# Patient Record
Sex: Male | Born: 1955
Health system: Southern US, Community
[De-identification: ages and names within clinical notes are randomized; demographics above are authoritative.]

## PROBLEM LIST (undated history)

## (undated) DIAGNOSIS — K219 Gastro-esophageal reflux disease without esophagitis: Secondary | ICD-10-CM

## (undated) DIAGNOSIS — N200 Calculus of kidney: Secondary | ICD-10-CM

## (undated) DIAGNOSIS — M1990 Unspecified osteoarthritis, unspecified site: Secondary | ICD-10-CM

## (undated) DIAGNOSIS — J45909 Unspecified asthma, uncomplicated: Secondary | ICD-10-CM

## (undated) DIAGNOSIS — E785 Hyperlipidemia, unspecified: Secondary | ICD-10-CM

## (undated) DIAGNOSIS — I10 Essential (primary) hypertension: Secondary | ICD-10-CM

## (undated) DIAGNOSIS — M109 Gout, unspecified: Secondary | ICD-10-CM

## (undated) DIAGNOSIS — T7840XA Allergy, unspecified, initial encounter: Secondary | ICD-10-CM

## (undated) DIAGNOSIS — N189 Chronic kidney disease, unspecified: Secondary | ICD-10-CM

## (undated) HISTORY — DX: Unspecified osteoarthritis, unspecified site: M19.90

## (undated) HISTORY — DX: Gastro-esophageal reflux disease without esophagitis: K21.9

## (undated) HISTORY — PX: LITHOTRIPSY: SUR834

## (undated) HISTORY — DX: Calculus of kidney: N20.0

## (undated) HISTORY — PX: OTHER SURGICAL HISTORY: SHX169

## (undated) HISTORY — DX: Unspecified asthma, uncomplicated: J45.909

## (undated) HISTORY — DX: Allergy, unspecified, initial encounter: T78.40XA

## (undated) HISTORY — DX: Essential (primary) hypertension: I10

## (undated) HISTORY — DX: Chronic kidney disease, unspecified: N18.9

## (undated) HISTORY — DX: Gout, unspecified: M10.9

## (undated) HISTORY — DX: Hyperlipidemia, unspecified: E78.5

---

## 2001-05-10 ENCOUNTER — Encounter: Admission: RE | Admit: 2001-05-10 | Discharge: 2001-05-10 | Payer: Self-pay | Admitting: Orthopaedic Surgery

## 2001-05-10 ENCOUNTER — Encounter: Payer: Self-pay | Admitting: Orthopaedic Surgery

## 2003-05-09 ENCOUNTER — Emergency Department (HOSPITAL_COMMUNITY): Admission: EM | Admit: 2003-05-09 | Discharge: 2003-05-09 | Payer: Self-pay | Admitting: Emergency Medicine

## 2007-05-09 ENCOUNTER — Ambulatory Visit: Payer: Self-pay | Admitting: Gastroenterology

## 2007-05-23 ENCOUNTER — Ambulatory Visit: Payer: Self-pay | Admitting: Gastroenterology

## 2008-06-11 DIAGNOSIS — Z8619 Personal history of other infectious and parasitic diseases: Secondary | ICD-10-CM | POA: Insufficient documentation

## 2011-03-21 ENCOUNTER — Ambulatory Visit (INDEPENDENT_AMBULATORY_CARE_PROVIDER_SITE_OTHER): Payer: BC Managed Care – PPO

## 2011-03-21 DIAGNOSIS — R05 Cough: Secondary | ICD-10-CM

## 2011-03-21 DIAGNOSIS — E789 Disorder of lipoprotein metabolism, unspecified: Secondary | ICD-10-CM

## 2011-03-21 DIAGNOSIS — I10 Essential (primary) hypertension: Secondary | ICD-10-CM

## 2011-03-21 DIAGNOSIS — M109 Gout, unspecified: Secondary | ICD-10-CM

## 2011-09-25 ENCOUNTER — Ambulatory Visit (INDEPENDENT_AMBULATORY_CARE_PROVIDER_SITE_OTHER): Payer: BC Managed Care – PPO | Admitting: Family Medicine

## 2011-09-25 VITALS — BP 115/80 | HR 56 | Temp 98.7°F | Resp 16 | Ht 70.75 in | Wt 188.2 lb

## 2011-09-25 DIAGNOSIS — I10 Essential (primary) hypertension: Secondary | ICD-10-CM

## 2011-09-25 DIAGNOSIS — E785 Hyperlipidemia, unspecified: Secondary | ICD-10-CM

## 2011-09-25 DIAGNOSIS — M109 Gout, unspecified: Secondary | ICD-10-CM

## 2011-09-25 LAB — COMPREHENSIVE METABOLIC PANEL WITH GFR
ALT: 34 U/L (ref 0–53)
CO2: 28 meq/L (ref 19–32)
Calcium: 10.4 mg/dL (ref 8.4–10.5)
Chloride: 102 meq/L (ref 96–112)
Creat: 0.9 mg/dL (ref 0.50–1.35)
Glucose, Bld: 93 mg/dL (ref 70–99)
Sodium: 140 meq/L (ref 135–145)
Total Bilirubin: 0.6 mg/dL (ref 0.3–1.2)
Total Protein: 7.6 g/dL (ref 6.0–8.3)

## 2011-09-25 LAB — COMPREHENSIVE METABOLIC PANEL
AST: 27 U/L (ref 0–37)
Albumin: 5.1 g/dL (ref 3.5–5.2)
Alkaline Phosphatase: 86 U/L (ref 39–117)
BUN: 9 mg/dL (ref 6–23)
Potassium: 5 mEq/L (ref 3.5–5.3)

## 2011-09-25 LAB — POCT URINALYSIS DIPSTICK
Bilirubin, UA: NEGATIVE
Glucose, UA: NEGATIVE
Ketones, UA: NEGATIVE
Leukocytes, UA: NEGATIVE
Nitrite, UA: NEGATIVE
Protein, UA: NEGATIVE
Spec Grav, UA: 1.02
Urobilinogen, UA: 0.2
pH, UA: 5.5

## 2011-09-25 MED ORDER — HYDROCHLOROTHIAZIDE 25 MG PO TABS: 25.0000 mg | ORAL_TABLET | Freq: Every day | ORAL | Status: DC

## 2011-09-25 MED ORDER — BENAZEPRIL HCL 10 MG PO TABS: 10.0000 mg | ORAL_TABLET | Freq: Every day | ORAL | Status: DC

## 2011-09-25 MED ORDER — ROSUVASTATIN CALCIUM 40 MG PO TABS: 40.0000 mg | ORAL_TABLET | Freq: Every day | ORAL | Status: DC

## 2011-09-25 MED ORDER — ALLOPURINOL 300 MG PO TABS: 300.0000 mg | ORAL_TABLET | Freq: Every day | ORAL | Status: DC

## 2011-09-25 NOTE — Progress Notes (Signed)
  [facility]:  Office Visit  Chief Complaint:  Chief Complaint  Patient presents with  . Medication Refill  . glucose check    fasting    HPI: Joshua [name] is a 56 y.o. male who complains of : Medication refills 1. HTN-120s/70s at home. No SEs. Does have minimal dizziness with standing and sitting too fast. Intermittent.  2. Hyperlipidemia-no SEs 3. Gout-Allopurinol  Compliant with meds.   Past Medical History  Diagnosis Date  . Hypertension   . Hyperlipidemia    History reviewed. No pertinent past surgical history. History   Social History  . Marital Status: Married    Spouse Name: N/A    Number of Children: N/A  . Years of Education: N/A   Social History Main Topics  . Smoking status: Never Smoker   . Smokeless tobacco: None  . Alcohol Use: No  . Drug Use: No  . Sexually Active: None   Other Topics Concern  . None   Social History Narrative  . None   Family History  Problem Relation Age of Onset  . Hyperlipidemia Mother   . Alcohol abuse Father    Allergies  Allergen Reactions  . Aspirin    Prior to Admission medications   Medication Sig Start Date End Date Taking? Authorizing Provider  allopurinol (ZYLOPRIM) 300 MG tablet Take 300 mg by mouth daily.   Yes Historical Provider, MD  benazepril (LOTENSIN) 10 MG tablet Take 10 mg by mouth daily.   Yes Historical Provider, MD  hydrochlorothiazide (HYDRODIURIL) 25 MG tablet Take 25 mg by mouth daily.   Yes Historical Provider, MD  rosuvastatin (CRESTOR) 40 MG tablet Take 40 mg by mouth daily.   Yes Historical Provider, MD     ROS: The patient denies fevers, chills, night sweats, unintentional weight loss, chest pain, palpitations, wheezing, dyspnea on exertion, nausea, vomiting, abdominal pain, dysuria, hematuria, melena, numbness, weakness, or tingling.   All other systems have been reviewed and were otherwise negative with the exception of those mentioned in the HPI and as above.     PHYSICAL EXAM: Filed Vitals:   09/25/11 1444  BP: 115/80  Pulse: 56  Temp: 98.7 F (37.1 C)  Resp: 16   Filed Vitals:   09/25/11 1444  Height: 5' 10.75" (1.797 m)  Weight: 188 lb 3.2 oz (85.367 kg)   Body mass index is 26.43 kg/(m^2).  General: Alert, no acute distress HEENT:  Normocephalic, atraumatic, oropharynx patent.  Cardiovascular:  Regular rate and rhythm, no rubs murmurs or gallops.  No Carotid bruits, radial pulse intact. No pedal edema.  Respiratory: Clear to auscultation bilaterally.  No wheezes, rales, or rhonchi.  No cyanosis, no use of accessory musculature GI: No organomegaly, abdomen is soft and non-tender, positive bowel sounds.  No masses. Skin: No rashes. Neurologic: Facial musculature symmetric. Psychiatric: Patient is appropriate throughout our interaction. Lymphatic: No cervical lymphadenopathy Musculoskeletal: Gait intact.   LABS:    EKG/XRAY:   Primary read interpreted by Dr. Conley Rolls at [facility].   ASSESSMENT/PLAN: Encounter Diagnoses  Name Primary?  . HTN (hypertension) Yes  . Hyperlipemia   . Gout    Pending labs: UA dip, CMP, TSH Recent fasting lipid was HDL 41, TG 69, TC 127 Meds refilled for 90 days with 1 RF F/u in 6 months   Joshua Gosch PHUONG, DO 09/28/2011 11:16 AM

## 2011-09-26 LAB — TSH: TSH: 1.876 u[IU]/mL (ref 0.350–4.500)

## 2011-10-01 ENCOUNTER — Encounter: Payer: Self-pay | Admitting: Family Medicine

## 2012-01-16 ENCOUNTER — Ambulatory Visit: Payer: BC Managed Care – PPO

## 2012-01-16 ENCOUNTER — Ambulatory Visit (INDEPENDENT_AMBULATORY_CARE_PROVIDER_SITE_OTHER): Payer: BC Managed Care – PPO | Admitting: Family Medicine

## 2012-01-16 VITALS — BP 117/69 | HR 66 | Temp 98.4°F | Resp 16 | Ht 70.75 in | Wt 181.2 lb

## 2012-01-16 DIAGNOSIS — N509 Disorder of male genital organs, unspecified: Secondary | ICD-10-CM

## 2012-01-16 DIAGNOSIS — N50819 Testicular pain, unspecified: Secondary | ICD-10-CM

## 2012-01-16 DIAGNOSIS — R109 Unspecified abdominal pain: Secondary | ICD-10-CM

## 2012-01-16 LAB — COMPREHENSIVE METABOLIC PANEL
ALT: 29 U/L (ref 0–53)
AST: 28 U/L (ref 0–37)
CO2: 32 mEq/L (ref 19–32)
Calcium: 10.5 mg/dL (ref 8.4–10.5)
Chloride: 102 mEq/L (ref 96–112)
Creat: 0.99 mg/dL (ref 0.50–1.35)
Potassium: 4.3 mEq/L (ref 3.5–5.3)
Sodium: 139 mEq/L (ref 135–145)
Total Protein: 7.5 g/dL (ref 6.0–8.3)

## 2012-01-16 LAB — POCT CBC
Granulocyte percent: 60 % (ref 37–80)
HCT, POC: 49.9 % (ref 43.5–53.7)
Hemoglobin: 16.5 g/dL (ref 14.1–18.1)
Lymph, poc: 2.4 (ref 0.6–3.4)
MCH, POC: 29.4 pg (ref 27–31.2)
MCHC: 33.1 g/dL (ref 31.8–35.4)
MCV: 89 fL (ref 80–97)
MID (cbc): 0.4 (ref 0–0.9)
MPV: 11.9 fL (ref 0–99.8)
POC Granulocyte: 4.3 (ref 2–6.9)
POC LYMPH PERCENT: 34 % (ref 10–50)
POC MID %: 6 % (ref 0–12)
Platelet Count, POC: 290 K/uL (ref 142–424)
RBC: 5.61 M/uL (ref 4.69–6.13)
RDW, POC: 13 %
WBC: 7.2 K/uL (ref 4.6–10.2)

## 2012-01-16 LAB — POCT URINALYSIS DIPSTICK
Glucose, UA: NEGATIVE
Nitrite, UA: NEGATIVE
Urobilinogen, UA: 0.2

## 2012-01-16 LAB — POCT UA - MICROSCOPIC ONLY
Casts, Ur, LPF, POC: NEGATIVE
Yeast, UA: NEGATIVE

## 2012-01-16 LAB — PSA: PSA: 4.72 ng/mL — ABNORMAL HIGH (ref <=4.00)

## 2012-01-16 NOTE — Progress Notes (Signed)
[address]   [place], [place]  [address]   [phone]  Subjective:    Patient ID: Joshua [name], male    DOB: 1956-03-08, 56 y.o.   MRN: [id number]  HPIThis 56 y.o. male presents for evaluation of lower abdominal pain intermittent.  Onset one month ago.  Pain and pressure lower abdomen, groin.  Duration all day.  Sit and drive all day.  Resolves for several days.  Moderate pain.  Tender to touch.  Pain free today.  No fever/chills/sweats/myalgias/malaise.  No nausea, vomiting, diarrhea, constipation.  No bloody or black stools.  No increase flatus.  Feels like gas.  No dysuria, hematuria, nocturia, frequency, urgency.  No genital swelling.  +tender to touch of testicles; pain radiates into lower abdomen.  No new sexual partners; married.  No history of prostatitis.  Last prostate exam three years ago. No recent medication change; no recent travel.  Colonoscopy age 68 normal; repeat in ten years.  No weight loss unintentional or weight loss.  Running a lot.  Position changes has not effect.  No indigestion, heartburn.  History of nephrolithiasis.      Review of Systems  Constitutional: Negative for fever, chills, diaphoresis and fatigue.  Gastrointestinal: Positive for abdominal pain. Negative for nausea, vomiting, diarrhea, constipation, blood in stool, abdominal distention, anal bleeding and rectal pain.  Genitourinary: Positive for testicular pain. Negative for dysuria, urgency, frequency, hematuria, flank pain, decreased urine volume, discharge, penile swelling, scrotal swelling, difficulty urinating, genital sores and penile pain.  Musculoskeletal: Negative for myalgias, back pain, joint swelling, arthralgias and gait problem.  Skin: Negative for color change, pallor, rash and wound.       Objective:   Physical Exam  Nursing note and vitals reviewed. Constitutional: He appears well-developed and well-nourished.  Eyes: Conjunctivae normal are normal. Pupils are equal, round, and  reactive to light.  Neck: Normal range of motion. Neck supple. No thyromegaly present.  Cardiovascular: Normal rate, regular rhythm and normal heart sounds.   No murmur heard. Pulmonary/Chest: Effort normal and breath sounds normal. He has no wheezes. He has no rales. He exhibits no tenderness.  Abdominal: Soft. Bowel sounds are normal. He exhibits no distension and no mass. There is no tenderness. There is no rebound and no guarding. Hernia confirmed negative in the right inguinal area and confirmed negative in the left inguinal area.  Genitourinary: Rectum normal, testes normal and penis normal. Rectal exam shows no fissure, no mass, no tenderness and anal tone normal. Prostate is enlarged. Prostate is not tender. Right testis shows no mass, no swelling and no tenderness. Left testis shows no mass, no swelling and no tenderness. Circumcised. No penile tenderness.  Musculoskeletal:       Right hip: He exhibits normal range of motion, normal strength and no tenderness.       Left hip: He exhibits normal range of motion, normal strength and no tenderness.       Lumbar back: He exhibits normal range of motion, no tenderness and no bony tenderness.  Lymphadenopathy:    He has no cervical adenopathy.       Right: No inguinal adenopathy present.       Left: No inguinal adenopathy present.     Results for orders placed in visit on 01/16/12  POCT CBC      Component Value Range   WBC 7.2  4.6 - 10.2 K/uL   Lymph, poc 2.4  0.6 - 3.4   POC LYMPH PERCENT 34.0  10 -  50 %L   MID (cbc) 0.4  0 - 0.9   POC MID % 6.0  0 - 12 %M   POC Granulocyte 4.3  2 - 6.9   Granulocyte percent 60.0  37 - 80 %G   RBC 5.61  4.69 - 6.13 M/uL   Hemoglobin 16.5  14.1 - 18.1 g/dL   HCT, POC 84.1  32.4 - 53.7 %   MCV 89.0  80 - 97 fL   MCH, POC 29.4  27 - 31.2 pg   MCHC 33.1  31.8 - 35.4 g/dL   RDW, POC 40.1     Platelet Count, POC 290  142 - 424 K/uL   MPV 11.9  0 - 99.8 fL  POCT UA - MICROSCOPIC ONLY       Component Value Range   WBC, Ur, HPF, POC 0-1     RBC, urine, microscopic 0-1     Bacteria, U Microscopic neg     Mucus, UA neg     Epithelial cells, urine per micros neg     Crystals, Ur, HPF, POC neg     Casts, Ur, LPF, POC neg     Yeast, UA neg    POCT URINALYSIS DIPSTICK      Component Value Range   Color, UA yellow     Clarity, UA clear     Glucose, UA neg     Bilirubin, UA neg     Ketones, UA neg     Spec Grav, UA 1.015     Blood, UA trace     pH, UA 7.0     Protein, UA neg     Urobilinogen, UA 0.2     Nitrite, UA neg     Leukocytes, UA Trace      [facility] reading (PRIMARY) by  Dr. Katrinka Blazing.  KUB:  NAD; moderate amount of stool.       Assessment & Plan:   1. Abdominal pain  POCT CBC, POCT UA - Microscopic Only, POCT urinalysis dipstick, Urine culture, PSA, Comprehensive metabolic panel, DG Abd 1 View  2. Testicular pain      1. Abdominal pain:  New.  Intermittent issue for past month.  Obtain labs including Urine culture, PSA.  KUB with moderate amount of stool but denies constipation; recommend increased water and fiber intake. 2. Testicular pain:  New.  Intermittent; associated with abdominal pain; obtain labs.  If persists, empirically treat for prostatitis.  No evidence of hernia.  No testicular masses.

## 2012-01-16 NOTE — Patient Instructions (Addendum)
1. Abdominal pain  POCT CBC, POCT UA - Microscopic Only, POCT urinalysis dipstick, Urine culture, PSA, Comprehensive metabolic panel, DG Abd 1 View     PLEASE CALL IF SYMPTOMS PERSIST OVER THE NEXT THREE WEEKS.

## 2012-01-17 ENCOUNTER — Telehealth: Payer: Self-pay

## 2012-01-17 NOTE — Telephone Encounter (Signed)
Pt returned our call please call again [phone]

## 2012-01-18 LAB — URINE CULTURE
Colony Count: NO GROWTH
Organism ID, Bacteria: NO GROWTH

## 2012-01-18 MED ORDER — CIPROFLOXACIN HCL 500 MG PO TABS: ORAL_TABLET | ORAL | Status: DC

## 2012-01-18 NOTE — Telephone Encounter (Signed)
Explained lab results and Dr Michaelle Copas instr's to pt concerning Abx tx and f/up. Pt agreed. Sending in Cipro Rx to [facility] as written by Dr Katrinka Blazing on lab result notes.

## 2012-01-22 NOTE — Progress Notes (Signed)
One month f-up scheduled for 12/4 with Dr. Katrinka Blazing. Joshua [name]

## 2012-02-06 ENCOUNTER — Other Ambulatory Visit: Payer: Self-pay | Admitting: *Deleted

## 2012-02-06 DIAGNOSIS — M109 Gout, unspecified: Secondary | ICD-10-CM

## 2012-02-06 DIAGNOSIS — E785 Hyperlipidemia, unspecified: Secondary | ICD-10-CM

## 2012-02-06 DIAGNOSIS — I10 Essential (primary) hypertension: Secondary | ICD-10-CM

## 2012-02-06 MED ORDER — BENAZEPRIL HCL 10 MG PO TABS: 10.0000 mg | ORAL_TABLET | Freq: Every day | ORAL | Status: DC

## 2012-02-06 MED ORDER — ROSUVASTATIN CALCIUM 40 MG PO TABS: 40.0000 mg | ORAL_TABLET | Freq: Every day | ORAL | Status: DC

## 2012-02-06 MED ORDER — HYDROCHLOROTHIAZIDE 25 MG PO TABS: 25.0000 mg | ORAL_TABLET | Freq: Every day | ORAL | Status: DC

## 2012-02-06 MED ORDER — ALLOPURINOL 300 MG PO TABS: 300.0000 mg | ORAL_TABLET | Freq: Every day | ORAL | Status: DC

## 2012-02-14 ENCOUNTER — Encounter: Payer: Self-pay | Admitting: Family Medicine

## 2012-02-14 ENCOUNTER — Ambulatory Visit (INDEPENDENT_AMBULATORY_CARE_PROVIDER_SITE_OTHER): Payer: BC Managed Care – PPO | Admitting: Family Medicine

## 2012-02-14 VITALS — BP 116/67 | HR 76 | Temp 98.6°F | Resp 16 | Ht 71.5 in | Wt 186.0 lb

## 2012-02-14 DIAGNOSIS — I10 Essential (primary) hypertension: Secondary | ICD-10-CM

## 2012-02-14 DIAGNOSIS — R972 Elevated prostate specific antigen [PSA]: Secondary | ICD-10-CM

## 2012-02-14 DIAGNOSIS — R1084 Generalized abdominal pain: Secondary | ICD-10-CM

## 2012-02-14 LAB — POCT CBC
Granulocyte percent: 56.7 %G (ref 37–80)
HCT, POC: 49.5 % (ref 43.5–53.7)
POC Granulocyte: 4.3 (ref 2–6.9)
POC LYMPH PERCENT: 37.2 %L (ref 10–50)
Platelet Count, POC: 247 10*3/uL (ref 142–424)
RBC: 5.57 M/uL (ref 4.69–6.13)
RDW, POC: 13.7 %

## 2012-02-14 NOTE — Progress Notes (Signed)
[address]   [place], [place]  [address]   [phone]  Subjective:    Patient ID: Joshua [name], male    DOB: [birth year], 56 y.o.   MRN: [id number]  HPIThis 56 y.o. male presents for one month follow-up:  1. Lower abdominal pain: one month follow-up; much improved; had been occurring every other day before appointment.  Decreased to 2-3 times per week and now no pain for past two weeks.  No fever/chills/sweats.  No n/v/d/c.  No bloody stools or melena.  No dysuria, urgency, frequency, nocturia, or hesitancy.  Normal appetite.  No weight loss.  2.  Elevated PSA/prostatitis:  Completed Cipro after last visit. PSA elevated at 4.6 at last visit.  Last PSA in 2010 at 1.0.  Denies current urinary symptoms.  3. HTN:  Attempting to lose weight and increase exercise; running regularly.  Diastolic BP in low 60s today; does not check BP at home regularly. Denies chest pain, palpitations, dizziness, lightheaded, headaches, shortness of breath, fatigue.   Review of Systems  Constitutional: Negative for fever, chills, diaphoresis, activity change and fatigue.  Respiratory: Negative for shortness of breath.   Cardiovascular: Negative for chest pain, palpitations and leg swelling.  Gastrointestinal: Negative for nausea, vomiting, abdominal pain, diarrhea, constipation, blood in stool, abdominal distention, anal bleeding and rectal pain.  Genitourinary: Negative for dysuria, urgency, frequency, hematuria, flank pain, decreased urine volume, discharge, penile swelling, scrotal swelling, genital sores, penile pain and testicular pain.  Neurological: Negative for dizziness, syncope, weakness, light-headedness, numbness and headaches.        Past Medical History  Diagnosis Date  . Hypertension   . Hyperlipidemia   . Allergy   . Asthma     Past Surgical History  Procedure Date  . Kidney stones     Prior to Admission medications   Medication Sig Start Date End Date Taking? Authorizing  Provider  allopurinol (ZYLOPRIM) 300 MG tablet Take 1 tablet (300 mg total) by mouth daily. 02/06/12  Yes Thao P Le, DO  benazepril (LOTENSIN) 10 MG tablet Take 1 tablet (10 mg total) by mouth daily. 02/06/12  Yes Thao P Le, DO  hydrochlorothiazide (HYDRODIURIL) 25 MG tablet Take 1 tablet (25 mg total) by mouth daily. 02/06/12  Yes Thao P Le, DO  rosuvastatin (CRESTOR) 40 MG tablet Take 1 tablet (40 mg total) by mouth daily. 02/06/12  Yes Thao P Le, DO  ciprofloxacin (CIPRO) 500 MG tablet Take one tablet by mouth two times daily for 3 weeks. 01/18/12   Ethelda Chick, MD    Allergies  Allergen Reactions  . Aspirin   . Benadryl (Diphenhydramine Hcl)     Allergic to the topical only    History   Social History  . Marital Status: Married    Spouse Name: N/A    Number of Children: N/A  . Years of Education: N/A   Occupational History  . Not on file.   Social History Main Topics  . Smoking status: Never Smoker   . Smokeless tobacco: Not on file  . Alcohol Use: No  . Drug Use: No  . Sexually Active: Yes    Birth Control/ Protection: None   Other Topics Concern  . Not on file   Social History Narrative  . No narrative on file    Family History  Problem Relation Age of Onset  . Hyperlipidemia Mother   . Alcohol abuse Father   . Alcohol abuse Brother   . Drug abuse Brother   .  Alcohol abuse Maternal Grandmother   . Hypertension Maternal Grandfather   . Hypertension Paternal Grandfather      Objective:   Physical Exam  Nursing note and vitals reviewed. Constitutional: He is oriented to person, place, and time. He appears well-developed and well-nourished. No distress.  Eyes: Conjunctivae normal are normal. Pupils are equal, round, and reactive to light.  Neck: Normal range of motion. Neck supple. No thyromegaly present.  Cardiovascular: Normal rate, regular rhythm, normal heart sounds and intact distal pulses.  Exam reveals no gallop and no friction rub.   No murmur  heard. Pulmonary/Chest: Effort normal and breath sounds normal. He has no wheezes. He has no rales.  Abdominal: Soft. Bowel sounds are normal. He exhibits no distension and no mass. There is no tenderness. There is no rebound and no guarding.  Lymphadenopathy:    He has no cervical adenopathy.  Neurological: He is alert and oriented to person, place, and time. No cranial nerve deficit. He exhibits normal muscle tone. Coordination normal.  Skin: Skin is warm and dry. He is not diaphoretic.  Psychiatric: He has a normal mood and affect. His behavior is normal.      Assessment & Plan:   1. PSA elevation  PSA, POCT CBC  2. Abdominal pain, generalized    3. Essential hypertension, benign       1.  PSA elevation/prostatitis:  New.  S/p Cipro therapy; asymptomatic. Obtain PSA level. 2.  Abdominal pain lower:  Improved/resolved.  No persistent symptoms. 3. HTN: stable/controlled.  Advised to start checking BP twice weekly; if diastolic BP<60, to follow-up for adjustments in medication.

## 2012-02-14 NOTE — Patient Instructions (Addendum)
1. PSA elevation  PSA, POCT CBC  2. Abdominal pain, generalized    3. Essential hypertension, benign

## 2012-03-01 ENCOUNTER — Emergency Department (HOSPITAL_COMMUNITY)
Admission: EM | Admit: 2012-03-01 | Discharge: 2012-03-01 | Disposition: A | Discharge status: Home / Self Care | Payer: BC Managed Care – PPO | Attending: Emergency Medicine | Admitting: Emergency Medicine

## 2012-03-01 ENCOUNTER — Encounter (HOSPITAL_COMMUNITY): Payer: Self-pay | Admitting: Family Medicine

## 2012-03-01 DIAGNOSIS — Y939 Activity, unspecified: Secondary | ICD-10-CM | POA: Insufficient documentation

## 2012-03-01 DIAGNOSIS — W540XXA Bitten by dog, initial encounter: Secondary | ICD-10-CM | POA: Insufficient documentation

## 2012-03-01 DIAGNOSIS — J45909 Unspecified asthma, uncomplicated: Secondary | ICD-10-CM | POA: Insufficient documentation

## 2012-03-01 DIAGNOSIS — I10 Essential (primary) hypertension: Secondary | ICD-10-CM | POA: Insufficient documentation

## 2012-03-01 DIAGNOSIS — Z79899 Other long term (current) drug therapy: Secondary | ICD-10-CM | POA: Insufficient documentation

## 2012-03-01 DIAGNOSIS — Z23 Encounter for immunization: Secondary | ICD-10-CM | POA: Insufficient documentation

## 2012-03-01 DIAGNOSIS — Y929 Unspecified place or not applicable: Secondary | ICD-10-CM | POA: Insufficient documentation

## 2012-03-01 DIAGNOSIS — S6990XA Unspecified injury of unspecified wrist, hand and finger(s), initial encounter: Secondary | ICD-10-CM | POA: Insufficient documentation

## 2012-03-01 DIAGNOSIS — S61459A Open bite of unspecified hand, initial encounter: Secondary | ICD-10-CM

## 2012-03-01 DIAGNOSIS — E785 Hyperlipidemia, unspecified: Secondary | ICD-10-CM | POA: Insufficient documentation

## 2012-03-01 MED ORDER — AMOXICILLIN-POT CLAVULANATE 875-125 MG PO TABS: 1.0000 | ORAL_TABLET | Freq: Two times a day (BID) | ORAL | Status: DC

## 2012-03-01 MED ORDER — SODIUM CHLORIDE 0.9 % IV SOLN
3.0000 g | Freq: Once | INTRAVENOUS | Status: AC
  Administered 2012-03-01: 3 g via INTRAVENOUS
  Filled 2012-03-01: qty 3

## 2012-03-01 MED ORDER — TETANUS-DIPHTH-ACELL PERTUSSIS 5-2.5-18.5 LF-MCG/0.5 IM SUSP
0.5000 mL | Freq: Once | INTRAMUSCULAR | Status: AC
  Administered 2012-03-01: 0.5 mL via INTRAMUSCULAR
  Filled 2012-03-01: qty 0.5

## 2012-03-01 NOTE — ED Notes (Signed)
Patient states that he was bitten by his dog an hour prior to arrival. Puncture wound x 3 to left hand. Tetanus shot greater than 10 years. Wound care provided at triage and covered with dressing.

## 2012-03-01 NOTE — ED Notes (Signed)
Puncture 3 wounds noted on pt's right hand.  Bleeding currently well controlled.  Pt states he flushed wound over sink at home.

## 2012-03-01 NOTE — ED Provider Notes (Signed)
History     CSN: [id number]  Arrival date & time 03/01/12  0138   First MD Initiated Contact with Patient 03/01/12 0241      Chief Complaint  Patient presents with  . Animal Bite    (Consider location/radiation/quality/duration/timing/severity/associated sxs/prior treatment) HPI Is a 56 year old male who was bitten by his own dog about an hour prior to arrival. He has 3 puncture wounds to his left hand. He states the wounds are minimally painful. Here gated the wounds copiously with water prior to arrival and the wounds were again irrigated on arrival. He states his tetanus is not up to date. He denies other injury. His dog is up-to-date on its rabies immunizations. There is no motor or sensory deficit of the left hand.  Past Medical History  Diagnosis Date  . Hypertension   . Hyperlipidemia   . Allergy   . Asthma     Past Surgical History  Procedure Date  . Kidney stones   . Lithotripsy     Family History  Problem Relation Age of Onset  . Hyperlipidemia Mother   . Alcohol abuse Father   . Alcohol abuse Brother   . Drug abuse Brother   . Alcohol abuse Maternal Grandmother   . Hypertension Maternal Grandfather   . Hypertension Paternal Grandfather     History  Substance Use Topics  . Smoking status: Never Smoker   . Smokeless tobacco: Not on file  . Alcohol Use: Yes     Comment: Occasional      Review of Systems  All other systems reviewed and are negative.    Allergies  Aspirin; Shrimp; and Benadryl  Home Medications   Current Outpatient Rx  Name  Route  Sig  Dispense  Refill  . ALBUTEROL SULFATE HFA 108 (90 BASE) MCG/ACT IN AERS   Inhalation   Inhale 2 puffs into the lungs every 6 (six) hours as needed.         . ALLOPURINOL 300 MG PO TABS   Oral   Take 1 tablet (300 mg total) by mouth daily.   90 tablet   0     Due for recheck   . BENAZEPRIL HCL 10 MG PO TABS   Oral   Take 1 tablet (10 mg total) by mouth daily.   90 tablet   0     Due for recheck   . EPINEPHRINE 0.3 MG/0.3ML IJ DEVI   Intramuscular   Inject 0.3 mg into the muscle once as needed.         Marland Kitchen HYDROCHLOROTHIAZIDE 25 MG PO TABS   Oral   Take 1 tablet (25 mg total) by mouth daily.   90 tablet   0     Due for recheck   . ROSUVASTATIN CALCIUM 40 MG PO TABS   Oral   Take 1 tablet (40 mg total) by mouth daily.   90 tablet   0     Due for recheck     BP 110/76  Pulse 62  Temp 98.8 F (37.1 C) (Oral)  Resp 18  SpO2 99%  Physical Exam General: Well-developed, well-nourished male in no acute distress; appearance consistent with age of record HENT: normocephalic, atraumatic Eyes: pupils equal round and reactive to light; extraocular muscles intact Neck: supple Heart: regular rate and rhythm Lungs: clear to auscultation bilaterally Abdomen: soft; nondistended Extremities: No deformity; full range of motion; pulses normal; 3 puncture wounds of left thenar eminence, left hand distally neurovascularly intact with intact  tendon function Neurologic: Awake, alert and oriented; motor function intact in all extremities and symmetric; no facial droop Skin: Warm and dry Psychiatric: Normal mood and affect    ED Course  Procedures (including critical care time)     MDM  Unasyn 3 g given in ED. Tetanus booster given.        Hanley Seamen, MD 03/01/12 [phone]

## 2012-03-31 ENCOUNTER — Other Ambulatory Visit: Payer: Self-pay | Admitting: Radiology

## 2012-03-31 DIAGNOSIS — I10 Essential (primary) hypertension: Secondary | ICD-10-CM

## 2012-03-31 DIAGNOSIS — E785 Hyperlipidemia, unspecified: Secondary | ICD-10-CM

## 2012-03-31 DIAGNOSIS — M109 Gout, unspecified: Secondary | ICD-10-CM

## 2012-03-31 MED ORDER — HYDROCHLOROTHIAZIDE 25 MG PO TABS: 25.0000 mg | ORAL_TABLET | Freq: Every day | ORAL | Status: DC

## 2012-03-31 MED ORDER — ROSUVASTATIN CALCIUM 40 MG PO TABS
40.0000 mg | ORAL_TABLET | Freq: Every day | ORAL | Status: DC
Start: 2012-03-31 — End: 2012-06-21

## 2012-03-31 MED ORDER — ALLOPURINOL 300 MG PO TABS: 300.0000 mg | ORAL_TABLET | Freq: Every day | ORAL | Status: DC

## 2012-03-31 MED ORDER — BENAZEPRIL HCL 10 MG PO TABS: 10.0000 mg | ORAL_TABLET | Freq: Every day | ORAL | Status: DC

## 2012-03-31 NOTE — Telephone Encounter (Signed)
Refills done.

## 2012-04-22 NOTE — Progress Notes (Signed)
Reviewed and agree.

## 2012-06-21 ENCOUNTER — Other Ambulatory Visit: Payer: Self-pay

## 2012-06-21 DIAGNOSIS — I10 Essential (primary) hypertension: Secondary | ICD-10-CM

## 2012-06-21 DIAGNOSIS — E785 Hyperlipidemia, unspecified: Secondary | ICD-10-CM

## 2012-06-21 MED ORDER — ROSUVASTATIN CALCIUM 40 MG PO TABS: 40.0000 mg | ORAL_TABLET | Freq: Every day | ORAL | Status: DC

## 2012-06-25 ENCOUNTER — Other Ambulatory Visit: Payer: Self-pay

## 2012-06-25 DIAGNOSIS — I10 Essential (primary) hypertension: Secondary | ICD-10-CM

## 2012-06-25 DIAGNOSIS — M109 Gout, unspecified: Secondary | ICD-10-CM

## 2012-06-25 DIAGNOSIS — E785 Hyperlipidemia, unspecified: Secondary | ICD-10-CM

## 2012-06-25 MED ORDER — HYDROCHLOROTHIAZIDE 25 MG PO TABS: 25.0000 mg | ORAL_TABLET | Freq: Every day | ORAL | Status: DC

## 2012-06-25 MED ORDER — BENAZEPRIL HCL 10 MG PO TABS: 10.0000 mg | ORAL_TABLET | Freq: Every day | ORAL | Status: DC

## 2012-06-25 MED ORDER — ALLOPURINOL 300 MG PO TABS: 300.0000 mg | ORAL_TABLET | Freq: Every day | ORAL | Status: DC

## 2012-07-20 ENCOUNTER — Ambulatory Visit (INDEPENDENT_AMBULATORY_CARE_PROVIDER_SITE_OTHER): Payer: BC Managed Care – PPO | Admitting: Family Medicine

## 2012-07-20 VITALS — BP 120/70 | HR 73 | Temp 98.0°F | Resp 16 | Ht 70.78 in | Wt 175.4 lb

## 2012-07-20 DIAGNOSIS — I10 Essential (primary) hypertension: Secondary | ICD-10-CM

## 2012-07-20 DIAGNOSIS — J309 Allergic rhinitis, unspecified: Secondary | ICD-10-CM

## 2012-07-20 DIAGNOSIS — J329 Chronic sinusitis, unspecified: Secondary | ICD-10-CM

## 2012-07-20 DIAGNOSIS — J302 Other seasonal allergic rhinitis: Secondary | ICD-10-CM

## 2012-07-20 DIAGNOSIS — E785 Hyperlipidemia, unspecified: Secondary | ICD-10-CM

## 2012-07-20 DIAGNOSIS — M109 Gout, unspecified: Secondary | ICD-10-CM

## 2012-07-20 MED ORDER — FLUTICASONE PROPIONATE 50 MCG/ACT NA SUSP: 2.0000 | Freq: Every day | NASAL | Status: DC

## 2012-07-20 MED ORDER — ALLOPURINOL 300 MG PO TABS: 300.0000 mg | ORAL_TABLET | Freq: Every day | ORAL | Status: DC

## 2012-07-20 MED ORDER — ROSUVASTATIN CALCIUM 40 MG PO TABS: 40.0000 mg | ORAL_TABLET | Freq: Every day | ORAL | Status: DC

## 2012-07-20 MED ORDER — BENAZEPRIL HCL 10 MG PO TABS: 10.0000 mg | ORAL_TABLET | Freq: Every day | ORAL | Status: DC

## 2012-07-20 MED ORDER — HYDROCHLOROTHIAZIDE 25 MG PO TABS: 25.0000 mg | ORAL_TABLET | Freq: Every day | ORAL | Status: DC

## 2012-07-20 MED ORDER — AMOXICILLIN 875 MG PO TABS: 875.0000 mg | ORAL_TABLET | Freq: Two times a day (BID) | ORAL | Status: DC

## 2012-07-20 NOTE — Progress Notes (Signed)
Subjective: 57 year old Joshua [name] with a history of annual seasonal allergic rhinitis. He's been having problems from no polyps last couple of weeks. The last 3 days he's noticed a distinct change. He's had more discomfort in his frontal sinus and maxillary sinus areas. He has felt more fatigued. He does take loratadine in the morning and usually a Benadryl at night.  he does not smoke. He has some postnasal drainage from this and a little throat discomfort and cough, though these are not the major components. He has not been running a distinct fever, though he says he usually runs in the 97 range it is been running in the 98. He is allergic to aspirin. He is on blood pressure and cholesterol medication.  Patient states that he has been having more orthostatic dizziness. He has lost some weight keeping in better shape, and thinks his blood pressure may be dropping low.  Objective: Healthy-appearing Joshua [name] in no major distress. His TMs are normal. Nose congested. Throat has a little postnasal drainage. Mouth normal. Neck supple without nodes or thyromegaly. Chest is clear to auscultation. Heart regular  Assessment: Seasonal allergic rhinitis, with possible secondary infection at this time Orthostatic dizziness Hypertension history  Plan: Fluticasone Amoxicillin Suggest he try splitting the benazapril 10 and taking just 5 mg daily. If he does well on 5 we can change his prescription if he calls back. He is to try and monitor pressure occasionally and make sure it is staying in a good range. Continue the loratadine and Benadryl Return if worse

## 2012-07-20 NOTE — Patient Instructions (Signed)
Take the amoxicillin twice daily for infection  Continue allergy medications  Try taking one half tablet of the benazepril and see how that does. Monitor your blood pressure.  Return sometime in the late summer for a followup on your blood pressure and to get your labs rechecked.

## 2012-09-18 ENCOUNTER — Telehealth: Payer: Self-pay

## 2012-09-18 DIAGNOSIS — I10 Essential (primary) hypertension: Secondary | ICD-10-CM

## 2012-09-18 DIAGNOSIS — E785 Hyperlipidemia, unspecified: Secondary | ICD-10-CM

## 2012-09-18 DIAGNOSIS — M109 Gout, unspecified: Secondary | ICD-10-CM

## 2012-09-18 MED ORDER — ALLOPURINOL 300 MG PO TABS: 300.0000 mg | ORAL_TABLET | Freq: Every day | ORAL | Status: DC

## 2012-09-18 MED ORDER — HYDROCHLOROTHIAZIDE 25 MG PO TABS: 25.0000 mg | ORAL_TABLET | Freq: Every day | ORAL | Status: DC

## 2012-09-18 MED ORDER — ROSUVASTATIN CALCIUM 40 MG PO TABS: 40.0000 mg | ORAL_TABLET | Freq: Every day | ORAL | Status: DC

## 2012-09-18 NOTE — Telephone Encounter (Signed)
[facility] called and req'd RFs of Crestor, Allopurinol and HCTZ to be sent there for when pt needs RFs. I am sending in the 90 day RFs that Dr Alwyn Ren put on the Rxs.

## 2012-12-22 ENCOUNTER — Emergency Department (HOSPITAL_COMMUNITY)
Admission: EM | Admit: 2012-12-22 | Discharge: 2012-12-23 | Disposition: A | Discharge status: Home / Self Care | Payer: BC Managed Care – PPO | Attending: Emergency Medicine | Admitting: Emergency Medicine

## 2012-12-22 ENCOUNTER — Encounter (HOSPITAL_COMMUNITY): Payer: Self-pay | Admitting: Emergency Medicine

## 2012-12-22 ENCOUNTER — Emergency Department (HOSPITAL_COMMUNITY): Payer: BC Managed Care – PPO

## 2012-12-22 DIAGNOSIS — K5900 Constipation, unspecified: Secondary | ICD-10-CM | POA: Insufficient documentation

## 2012-12-22 DIAGNOSIS — Z87442 Personal history of urinary calculi: Secondary | ICD-10-CM | POA: Insufficient documentation

## 2012-12-22 DIAGNOSIS — R109 Unspecified abdominal pain: Secondary | ICD-10-CM | POA: Insufficient documentation

## 2012-12-22 DIAGNOSIS — J45909 Unspecified asthma, uncomplicated: Secondary | ICD-10-CM | POA: Insufficient documentation

## 2012-12-22 DIAGNOSIS — R110 Nausea: Secondary | ICD-10-CM | POA: Insufficient documentation

## 2012-12-22 DIAGNOSIS — E785 Hyperlipidemia, unspecified: Secondary | ICD-10-CM | POA: Insufficient documentation

## 2012-12-22 DIAGNOSIS — K5904 Chronic idiopathic constipation: Secondary | ICD-10-CM

## 2012-12-22 DIAGNOSIS — Z79899 Other long term (current) drug therapy: Secondary | ICD-10-CM | POA: Insufficient documentation

## 2012-12-22 DIAGNOSIS — I10 Essential (primary) hypertension: Secondary | ICD-10-CM | POA: Insufficient documentation

## 2012-12-22 MED ORDER — SODIUM CHLORIDE 0.9 % IV SOLN
Freq: Once | INTRAVENOUS | Status: AC
  Administered 2012-12-22: via INTRAVENOUS

## 2012-12-22 MED ORDER — ONDANSETRON HCL 4 MG/2ML IJ SOLN
4.0000 mg | Freq: Once | INTRAMUSCULAR | Status: AC
  Administered 2012-12-22: 4 mg via INTRAVENOUS
  Filled 2012-12-22: qty 2

## 2012-12-22 MED ORDER — HYDROMORPHONE HCL PF 1 MG/ML IJ SOLN
1.0000 mg | Freq: Once | INTRAMUSCULAR | Status: AC
  Administered 2012-12-22: 1 mg via INTRAVENOUS
  Filled 2012-12-22: qty 1

## 2012-12-22 NOTE — ED Provider Notes (Addendum)
CSN: [id number]     Arrival date & time 12/22/12  2308 History   First MD Initiated Contact with Patient 12/22/12 2314     Chief Complaint  Patient presents with  . Flank Pain   (Consider location/radiation/quality/duration/timing/severity/associated sxs/prior Treatment) HPI Comments: Patient with R flank pain for the past 6 hours. Has Hx kidney stones but not for the past 10 years.  Has not taken any medications for discomfort   Patient is a 57 y.o. male presenting with flank pain. The history is provided by the patient.  Flank Pain This is a new problem. The current episode started today. The problem occurs constantly. The problem has been waxing and waning. Associated symptoms include nausea. Pertinent negatives include no abdominal pain, coughing, fever, rash or vomiting. Nothing aggravates the symptoms. He has tried nothing for the symptoms. The treatment provided no relief.    Past Medical History  Diagnosis Date  . Hypertension   . Hyperlipidemia   . Allergy   . Asthma    Past Surgical History  Procedure Laterality Date  . Kidney stones    . Lithotripsy     Family History  Problem Relation Age of Onset  . Hyperlipidemia Mother   . Diabetes Mother   . Alcohol abuse Father   . Alcohol abuse Brother   . Drug abuse Brother   . Alcohol abuse Maternal Grandmother   . Hypertension Maternal Grandfather   . Hypertension Paternal Grandfather    History  Substance Use Topics  . Smoking status: Never Smoker   . Smokeless tobacco: Not on file  . Alcohol Use: Yes     Comment: Occasional    Review of Systems  Constitutional: Negative for fever.  Respiratory: Negative for cough.   Gastrointestinal: Positive for nausea. Negative for vomiting and abdominal pain.  Genitourinary: Positive for flank pain. Negative for dysuria, frequency and hematuria.  Skin: Negative for rash.  All other systems reviewed and are negative.    Allergies  Aspirin; Cherry; Other; Shrimp;  Toradol; and Benadryl  Home Medications   Current Outpatient Rx  Name  Route  Sig  Dispense  Refill  . allopurinol (ZYLOPRIM) 300 MG tablet   Oral   Take 1 tablet (300 mg total) by mouth daily.   90 tablet   0   . benazepril (LOTENSIN) 10 MG tablet   Oral   Take 5 mg by mouth daily.         . hydrochlorothiazide (HYDRODIURIL) 25 MG tablet   Oral   Take 1 tablet (25 mg total) by mouth daily.   90 tablet   0   . ibuprofen (ADVIL,MOTRIN) 200 MG tablet   Oral   Take 600 mg by mouth every 6 (six) hours as needed for pain.         . rosuvastatin (CRESTOR) 40 MG tablet   Oral   Take 1 tablet (40 mg total) by mouth daily.   90 tablet   0   . albuterol (PROVENTIL HFA;VENTOLIN HFA) 108 (90 BASE) MCG/ACT inhaler   Inhalation   Inhale 2 puffs into the lungs every 6 (six) hours as needed.         Marland Kitchen EPINEPHrine (EPI-PEN) 0.3 mg/0.3 mL DEVI   Intramuscular   Inject 0.3 mg into the muscle once as needed.         Marland Kitchen HYDROcodone-acetaminophen (NORCO/VICODIN) 5-325 MG per tablet   Oral   Take 1 tablet by mouth every 6 (six) hours as needed for  pain.   6 tablet   0   . ondansetron (ZOFRAN) 4 MG tablet   Oral   Take 1 tablet (4 mg total) by mouth every 6 (six) hours.   12 tablet   0   . polyethylene glycol powder (GLYCOLAX/MIRALAX) powder   Oral   Take 17 g by mouth daily.   255 g   0     Until stool soft    BP 122/77  Pulse 52  Temp(Src) 97.4 F (36.3 C) (Oral)  Resp 18  SpO2 100% Physical Exam  Vitals reviewed. Constitutional: He is oriented to person, place, and time. He appears well-developed and well-nourished.  HENT:  Head: Normocephalic.  Eyes: Pupils are equal, round, and reactive to light.  Neck: Normal range of motion.  Cardiovascular: Normal rate and regular rhythm.   Pulmonary/Chest: Effort normal and breath sounds normal.  Abdominal: Soft. Bowel sounds are normal. He exhibits no distension. There is no tenderness.  Musculoskeletal: Normal  range of motion.  Neurological: He is alert and oriented to person, place, and time.  Skin: Skin is warm. No rash noted.    ED Course  Procedures (including critical care time) Labs Review Labs Reviewed  CBC WITH DIFFERENTIAL - Abnormal; Notable for the following:    MCHC 36.7 (*)    All other components within normal limits  BASIC METABOLIC PANEL - Abnormal; Notable for the following:    Sodium 133 (*)    Potassium 3.2 (*)    Chloride 95 (*)    Glucose, Bld 123 (*)    GFR calc non Af Amer 75 (*)    GFR calc Af Amer 87 (*)    All other components within normal limits  URINALYSIS, ROUTINE W REFLEX MICROSCOPIC   Imaging Review Ct Abdomen Pelvis Wo Contrast  12/23/2012   CLINICAL DATA:  Right flank pain.  EXAM: CT ABDOMEN AND PELVIS WITHOUT CONTRAST  TECHNIQUE: Multidetector CT imaging of the abdomen and pelvis was performed following the standard protocol without intravenous contrast.  COMPARISON:  None.  FINDINGS: Unenhanced CT was performed per clinician order. Lack of IV contrast limits sensitivity and specificity, especially for evaluation of abdominal/pelvic solid viscera.  Lung bases are clear. Unremarkable appearing liver, spleen, gallbladder, pancreas, and adrenal glands.  There are tiny bilateral nonobstructing intrarenal calculi. There is no hydronephrosis or renal mass. No proximal or distal ureteral calculi are evident. No bladder calculi are seen.  Unremarkable appearing stomach, small bowel and colon. Moderate stool burden. Normal appendix. Non aneurysmal atherosclerotic calcification of the aorta. Slight prostatic enlargement with calcification. No osseous abnormality.  IMPRESSION: Tiny bilateral nonobstructing intrarenal calculi. No hydronephrosis or distal ureteral calculi. No acute intra-abdominal or pelvic findings.   Electronically Signed   By: Davonna Belling M.D.   On: 12/23/2012 00:13    EKG Interpretation   None       MDM   1. Flank pain   2. Constipation -  functional    Discuss CT scan findings with patient and his family.  He was given a one-time supplement of potassium for a 3.2 level and encouraged to use MiraLAX on a regular basis twice a day for the next several days until his stool has softened disease.  The past, and to followup with his primary care physician    Arman Filter, NP 12/23/12 5784  Arman Filter, NP 01/03/13 2224

## 2012-12-22 NOTE — ED Notes (Signed)
The pt has had rt flank pain for 6 hours with nausea.  No bloody urine

## 2012-12-22 NOTE — ED Notes (Signed)
Pt states he has a hx of kidney stones, pt states this pain feels a lot like his kidney stones in the past. Pt reports kidney stones 14 years ago and 10 years ago. Pt states the pain came on all of a sudden tonight. Pt denies any urinary issues. Pt states in the past he has been able to pass his stones however one time he had to undergo lithotripsy.

## 2012-12-23 ENCOUNTER — Ambulatory Visit (INDEPENDENT_AMBULATORY_CARE_PROVIDER_SITE_OTHER): Payer: BC Managed Care – PPO | Admitting: Family Medicine

## 2012-12-23 VITALS — BP 132/80 | HR 50 | Temp 97.2°F | Resp 14 | Ht 72.0 in | Wt 176.0 lb

## 2012-12-23 DIAGNOSIS — G5710 Meralgia paresthetica, unspecified lower limb: Secondary | ICD-10-CM

## 2012-12-23 DIAGNOSIS — I10 Essential (primary) hypertension: Secondary | ICD-10-CM

## 2012-12-23 DIAGNOSIS — E785 Hyperlipidemia, unspecified: Secondary | ICD-10-CM

## 2012-12-23 DIAGNOSIS — G5711 Meralgia paresthetica, right lower limb: Secondary | ICD-10-CM

## 2012-12-23 DIAGNOSIS — K5900 Constipation, unspecified: Secondary | ICD-10-CM

## 2012-12-23 LAB — BASIC METABOLIC PANEL
BUN: 13 mg/dL (ref 6–23)
Calcium: 9.2 mg/dL (ref 8.4–10.5)
GFR calc Af Amer: 87 mL/min — ABNORMAL LOW (ref >90)
GFR calc non Af Amer: 75 mL/min — ABNORMAL LOW (ref >90)
Potassium: 3.2 mEq/L — ABNORMAL LOW (ref 3.5–5.1)

## 2012-12-23 LAB — CBC WITH DIFFERENTIAL/PLATELET
Basophils Relative: 1 % (ref 0–1)
Eosinophils Absolute: 0.4 10*3/uL (ref 0.0–0.7)
Eosinophils Relative: 5 % (ref 0–5)
Hemoglobin: 14.3 g/dL (ref 13.0–17.0)
MCH: 29.9 pg (ref 26.0–34.0)
MCHC: 36.7 g/dL — ABNORMAL HIGH (ref 30.0–36.0)
MCV: 81.4 fL (ref 78.0–100.0)
Monocytes Relative: 7 % (ref 3–12)
Neutrophils Relative %: 46 % (ref 43–77)
Platelets: 181 10*3/uL (ref 150–400)

## 2012-12-23 MED ORDER — BENAZEPRIL HCL 10 MG PO TABS: 5.0000 mg | ORAL_TABLET | Freq: Every day | ORAL | Status: DC

## 2012-12-23 MED ORDER — HYDROCHLOROTHIAZIDE 25 MG PO TABS: 25.0000 mg | ORAL_TABLET | Freq: Every day | ORAL | Status: DC

## 2012-12-23 MED ORDER — POLYETHYLENE GLYCOL 3350 17 GM/SCOOP PO POWD: 17.0000 g | Freq: Every day | ORAL | Status: DC

## 2012-12-23 MED ORDER — ONDANSETRON HCL 4 MG/2ML IJ SOLN
4.0000 mg | Freq: Once | INTRAMUSCULAR | Status: AC
  Administered 2012-12-23: 4 mg via INTRAVENOUS
  Filled 2012-12-23: qty 2

## 2012-12-23 MED ORDER — ONDANSETRON HCL 4 MG PO TABS: 4.0000 mg | ORAL_TABLET | Freq: Four times a day (QID) | ORAL | Status: DC

## 2012-12-23 MED ORDER — HYDROMORPHONE HCL PF 1 MG/ML IJ SOLN: 1.0000 mg | Freq: Once | INTRAMUSCULAR | Status: DC

## 2012-12-23 MED ORDER — ALBUTEROL SULFATE HFA 108 (90 BASE) MCG/ACT IN AERS: 2.0000 | INHALATION_SPRAY | Freq: Four times a day (QID) | RESPIRATORY_TRACT | Status: DC | PRN

## 2012-12-23 MED ORDER — POTASSIUM CHLORIDE CRYS ER 20 MEQ PO TBCR
20.0000 meq | EXTENDED_RELEASE_TABLET | Freq: Once | ORAL | Status: AC
  Administered 2012-12-23: 20 meq via ORAL
  Filled 2012-12-23: qty 1

## 2012-12-23 MED ORDER — HYDROMORPHONE HCL PF 1 MG/ML IJ SOLN
1.0000 mg | Freq: Once | INTRAMUSCULAR | Status: AC
  Administered 2012-12-23: 1 mg via INTRAVENOUS
  Filled 2012-12-23: qty 1

## 2012-12-23 MED ORDER — ROSUVASTATIN CALCIUM 40 MG PO TABS: 40.0000 mg | ORAL_TABLET | Freq: Every day | ORAL | Status: DC

## 2012-12-23 MED ORDER — HYDROCODONE-ACETAMINOPHEN 5-325 MG PO TABS: 1.0000 | ORAL_TABLET | Freq: Four times a day (QID) | ORAL | Status: DC | PRN

## 2012-12-23 NOTE — ED Provider Notes (Signed)
Medical screening examination/treatment/procedure(s) were conducted as a shared visit with non-physician practitioner(s) and myself.  I personally evaluated the patient during the encounter  Patient with vague right flank/right low back pain. Normal VS, benign abdominal exam, reassuring labs. Non contrasted CT scans shows only nephrolithiasis but no hyrdoureter or ureterolithiasis. Patient feeling better after symptomatic management. Suspect myofascial pain. Stable for d/c.   Brandt Loosen, MD 12/23/12 [phone]

## 2012-12-23 NOTE — ED Notes (Signed)
Attempted to discharge pt, pt very nauseous. MD informed. See new orders.

## 2012-12-23 NOTE — Patient Instructions (Signed)
For your constipation, we'll need to try several ways to treat this:  1)  First, take a stool softener twice daily for the next week.  After that, just take it once daily. 2) Take either Miralax or prune juice once in the AM and once in the PM for the next several days.  Stop taking it if you have diarrhea.  You can increase this to 3 times a day to help you go as well. 3) If you still haven't gone to the restroom after three days, it's time to try either a suppository or an enema.  This should make you go. 4) Continue to take the stool softener and Miralax, even if you use the suppository.  Your goal is to have a soft bowel movement once daily. Once you start going to the bathroom, cut back to once or twice daily Miralax/prune until you achieve that goal.     If the leg pain isn't getting better, let me know.    Talk to Dr. Alwyn Ren about your blood pressure medicines.    Meralgia Paresthetica  Meralgia paresthetica (MP) is a disorder characterized by tingling, numbness, and burning pain in the outer side of the thigh. It occurs in men more than women. MP is generally found in middle-aged or overweight people. Sometimes, the disorder may disappear. CAUSES The disorder is caused by a nerve in the thigh being squeezed (compressed). MP may be associated with tight clothing, pregnancy, diabetes, and being overweight (obese). SYMPTOMS  Tingling, numbness, and burning in the outer thigh.  An area of the skin may be painful and sensitive to the touch. The symptoms often worsen after walking or standing. TREATMENT  Treatment is based on your symptoms and is mainly supportive. Treatment may include:  Wearing looser clothing.  Losing weight.  Avoiding prolonged standing or walking.  Taking medication.  Surgery if the pain is peristent or severe. MP usually eases or disappears after treatment. Surgery is not always fully successful. Document Released: 02/17/2002 Document Revised:  05/22/2011 Document Reviewed: 02/27/2005 [facility] Patient Information 2014 [place], [place].

## 2012-12-23 NOTE — Progress Notes (Signed)
Joshua [name] is a 57 y.o. male who presents to Urgent Care today with 2 main complaints:  One. Right thigh pain: Describes numbness and tingling lateral aspect of right thigh. This is been present for about a week. Notices when running outside or seated for long period of time. 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 No motor weakness. No back pain. No bladder or bowel incontinence.  #2. Abdominal pain: This is been present for the past 4-5 days. He describes pain in his lower abdomen. Describes this is mild tenderness and fairly diffuse. He states he usually has a soft or runny bowel movement on a daily basis however he has not had a bowel movement in the past 2 days. He is continuing to pass gas. No nausea or vomiting.  He presented to the emergency department recently with similar symptoms. He has had multiple kidney in the past and he is concerned this is another kidney stone. He had a CT of his abdomen in the emergency department and was told that it was negative but that he did have constipation. Constipation seems to be an issue for him for least the past year if not longer.  He is used MiraLax once, which was last night, since being in the emergency department and is concerned that he has not had a bowel movement despite using MiraLax  PMH reviewed.  Past Medical History  Diagnosis Date  . Hypertension   . Hyperlipidemia   . Allergy   . Asthma    Past Surgical History  Procedure Laterality Date  . Kidney stones    . Lithotripsy      Medications reviewed. Current Outpatient Prescriptions  Medication Sig Dispense Refill  . albuterol (PROVENTIL HFA;VENTOLIN HFA) 108 (90 BASE) MCG/ACT inhaler Inhale 2 puffs into the lungs every 6 (six) hours as needed.      . benazepril (LOTENSIN) 10 MG tablet Take 5 mg by mouth daily.      Marland Kitchen EPINEPHrine (EPI-PEN) 0.3 mg/0.3 mL DEVI Inject 0.3 mg into the muscle once as needed.      . hydrochlorothiazide (HYDRODIURIL) 25 MG  tablet Take 1 tablet (25 mg total) by mouth daily.  90 tablet  0  . HYDROcodone-acetaminophen (NORCO/VICODIN) 5-325 MG per tablet Take 1 tablet by mouth every 6 (six) hours as needed for pain.  6 tablet  0  . ibuprofen (ADVIL,MOTRIN) 200 MG tablet Take 600 mg by mouth every 6 (six) hours as needed for pain.      Marland Kitchen ondansetron (ZOFRAN) 4 MG tablet Take 1 tablet (4 mg total) by mouth every 6 (six) hours.  12 tablet  0  . polyethylene glycol powder (GLYCOLAX/MIRALAX) powder Take 17 g by mouth daily.  255 g  0  . rosuvastatin (CRESTOR) 40 MG tablet Take 1 tablet (40 mg total) by mouth daily.  90 tablet  0   No current facility-administered medications for this visit.    ROS as above otherwise neg.  No chest pain, palpitations, SOB, Fever, Chills, Abd pain, N/V/D.   Physical Exam:  BP 132/80  Pulse 50  Temp(Src) 97.2 F (36.2 C)  Resp 14  Ht 6' (1.829 m)  Wt 176 lb (79.833 kg)  BMI 23.86 kg/m2  SpO2 100% Gen:  Alert, cooperative patient who appears stated age in no acute distress.  Vital signs reviewed. HEENT: EOMI,  MMM Pulm:  Clear to auscultation bilaterally with good air movement.  No wheezes or rales noted.  Cardiac:  Regular rate and rhythm without murmur auscultated.  Good S1/S2. Abd:  Soft/nondistended. Minimally tender suprapubically. I do not palpate a palpable stool for him. He has good bowel sounds. No guarding or rebound. As we continue with our conversation he is able to move out of the bed and jump up and down without any further pain in his abdomen. Exts: No edema bilateral ankles or feet. Neuro: Decreased sensation lateral aspect of right thigh. Otherwise sensation intact throughout. Strength is 5 out of 5 bilateral lower extremities  Assessment and Plan:  #1. Constipation: -Please see instructions for full details. -Reassured patient that it is normal did not have bowel movement after just one dose MiraLax. He can continue to increase this on a daily basis. -As this  is an ongoing problem I recommended he also started twice a day stool softener and continue at least daily stool softener for the next several weeks. -Of note he has had a normal colonoscopy within the past 2 years. Also had negative CT scan in the emergency department.  #2. Meralgia paresthetica: -Patient runs 5-6 miles multiple times a week including up to 15 miles at a time every week. -Believe this is secondary to his running as well as his seated job contributing to his symptoms. -I did discuss diagnosis and prognosis the patient. He was satisfied with explanation.  Tobey Grim, MD

## 2012-12-23 NOTE — ED Notes (Signed)
Pt attempting to void.  

## 2012-12-23 NOTE — ED Notes (Signed)
Duplicate order discontinued.  

## 2012-12-23 NOTE — ED Notes (Signed)
MD at bedside. 

## 2012-12-23 NOTE — ED Notes (Signed)
Pt unable to void 

## 2012-12-25 DIAGNOSIS — K5900 Constipation, unspecified: Secondary | ICD-10-CM | POA: Insufficient documentation

## 2012-12-25 DIAGNOSIS — G5711 Meralgia paresthetica, right lower limb: Secondary | ICD-10-CM | POA: Insufficient documentation

## 2012-12-27 ENCOUNTER — Other Ambulatory Visit: Payer: Self-pay

## 2012-12-27 DIAGNOSIS — E785 Hyperlipidemia, unspecified: Secondary | ICD-10-CM

## 2012-12-27 DIAGNOSIS — I10 Essential (primary) hypertension: Secondary | ICD-10-CM

## 2012-12-27 MED ORDER — ALLOPURINOL 300 MG PO TABS: 300.0000 mg | ORAL_TABLET | Freq: Every day | ORAL | Status: DC

## 2012-12-27 MED ORDER — ROSUVASTATIN CALCIUM 40 MG PO TABS: 40.0000 mg | ORAL_TABLET | Freq: Every day | ORAL | Status: DC

## 2012-12-27 MED ORDER — BENAZEPRIL HCL 10 MG PO TABS: 5.0000 mg | ORAL_TABLET | Freq: Every day | ORAL | Status: DC

## 2013-01-02 ENCOUNTER — Other Ambulatory Visit: Payer: Self-pay

## 2013-01-02 DIAGNOSIS — E785 Hyperlipidemia, unspecified: Secondary | ICD-10-CM

## 2013-01-02 DIAGNOSIS — I10 Essential (primary) hypertension: Secondary | ICD-10-CM

## 2013-01-02 MED ORDER — HYDROCHLOROTHIAZIDE 25 MG PO TABS: 25.0000 mg | ORAL_TABLET | Freq: Every day | ORAL | Status: DC

## 2013-01-02 MED ORDER — BENAZEPRIL HCL 10 MG PO TABS: 5.0000 mg | ORAL_TABLET | Freq: Every day | ORAL | Status: DC

## 2013-01-05 NOTE — ED Provider Notes (Signed)
Medical screening examination/treatment/procedure(s) were performed by non-physician practitioner and as supervising physician I was immediately available for consultation/collaboration.     Brandt Loosen, MD 01/05/13 [phone]

## 2013-01-12 ENCOUNTER — Ambulatory Visit (INDEPENDENT_AMBULATORY_CARE_PROVIDER_SITE_OTHER): Payer: BC Managed Care – PPO | Admitting: Emergency Medicine

## 2013-01-12 VITALS — BP 128/78 | HR 78 | Temp 98.1°F | Resp 18 | Ht 72.0 in | Wt 176.0 lb

## 2013-01-12 DIAGNOSIS — G5710 Meralgia paresthetica, unspecified lower limb: Secondary | ICD-10-CM

## 2013-01-12 MED ORDER — PREDNISONE 10 MG PO KIT: PACK | ORAL | Status: DC

## 2013-01-12 MED ORDER — GABAPENTIN 300 MG PO CAPS: 300.0000 mg | ORAL_CAPSULE | Freq: Three times a day (TID) | ORAL | Status: DC

## 2013-01-12 NOTE — Addendum Note (Signed)
Addended by: Carmelina Dane on: 01/12/2013 04:34 PM   Modules accepted: Orders

## 2013-01-12 NOTE — Progress Notes (Signed)
[facility] [address] [place] [address] [phone]  Date:  01/12/2013   Name:  Joshua [name]   DOB:  23-Jun-1955   MRN:  [id number]  PCP:  No PCP Per Patient    Chief Complaint: Follow-up   History of Present Illness:  Joshua [name] is a 57 y.o. very pleasant male patient who presents with the following:  Seen by Dr Gwendolyn Grant for Meralgia paresthetica.  Continues to have significant pain and hypersensitivity on his anterior right thigh.  Seems to be worsening.  Denies other complaint or health concern today.   Patient Active Problem List   Diagnosis Date Noted  . Meralgia paresthetica of right side 12/25/2012  . Unspecified constipation 12/25/2012  . Seasonal allergic rhinitis 07/20/2012    Past Medical History  Diagnosis Date  . Hypertension   . Hyperlipidemia   . Allergy   . Asthma     Past Surgical History  Procedure Laterality Date  . Kidney stones    . Lithotripsy      History  Substance Use Topics  . Smoking status: Never Smoker   . Smokeless tobacco: Not on file  . Alcohol Use: Yes     Comment: Occasional    Family History  Problem Relation Age of Onset  . Hyperlipidemia Mother   . Diabetes Mother   . Alcohol abuse Father   . Alcohol abuse Brother   . Drug abuse Brother   . Alcohol abuse Maternal Grandmother   . Hypertension Maternal Grandfather   . Hypertension Paternal Grandfather   . Cancer Sister   . Cancer Paternal Aunt     Allergies  Allergen Reactions  . Aspirin Anaphylaxis  . Cherry Itching  . Other Itching    almonds  . Shrimp [Shellfish Allergy]     Almond cherry  . Toradol [Ketorolac Tromethamine]   . Benadryl [Diphenhydramine Hcl] Rash    Allergic to the topical only    Medication list has been reviewed and updated.  Current Outpatient Prescriptions on File Prior to Visit  Medication Sig Dispense Refill  . albuterol (PROVENTIL HFA;VENTOLIN HFA) 108 (90 BASE) MCG/ACT inhaler Inhale 2 puffs  into the lungs every 6 (six) hours as needed.  8.5 g  0  . allopurinol (ZYLOPRIM) 300 MG tablet Take 1 tablet (300 mg total) by mouth daily.  90 tablet  1  . benazepril (LOTENSIN) 10 MG tablet Take 0.5 tablets (5 mg total) by mouth daily.  45 tablet  1  . EPINEPHrine (EPI-PEN) 0.3 mg/0.3 mL DEVI Inject 0.3 mg into the muscle once as needed.      . hydrochlorothiazide (HYDRODIURIL) 25 MG tablet Take 1 tablet (25 mg total) by mouth daily.  90 tablet  1  . HYDROcodone-acetaminophen (NORCO/VICODIN) 5-325 MG per tablet Take 1 tablet by mouth every 6 (six) hours as needed for pain.  6 tablet  0  . ibuprofen (ADVIL,MOTRIN) 200 MG tablet Take 600 mg by mouth every 6 (six) hours as needed for pain.      Marland Kitchen ondansetron (ZOFRAN) 4 MG tablet Take 1 tablet (4 mg total) by mouth every 6 (six) hours.  12 tablet  0  . polyethylene glycol powder (GLYCOLAX/MIRALAX) powder Take 17 g by mouth daily.  255 g  0  . rosuvastatin (CRESTOR) 40 MG tablet Take 1 tablet (40 mg total) by mouth daily.  90 tablet  1   No current facility-administered medications on file prior to visit.    Review  of Systems:  As per HPI, otherwise negative.    Physical Examination: Filed Vitals:   01/12/13 1612  BP: 128/78  Pulse: 78  Temp: 98.1 F (36.7 C)  Resp: 18   Filed Vitals:   01/12/13 1612  Height: 6' (1.829 m)  Weight: 176 lb (79.833 kg)   Body mass index is 23.86 kg/(m^2). Ideal Body Weight: Weight in (lb) to have BMI = 25: 183.9   GEN: WDWN, NAD, Non-toxic, Alert & Oriented x 3 HEENT: Atraumatic, Normocephalic.  Ears and Nose: No external deformity. EXTR: No clubbing/cyanosis/edema NEURO: Normal gait.  PSYCH: Normally interactive. Conversant. Not depressed or anxious appearing.  Calm demeanor.    Assessment and Plan: Meralgia paresthetica abapentin sterapred Follow up with sports medicine.   Signed,  Phillips Odor, MD

## 2013-01-13 ENCOUNTER — Ambulatory Visit (INDEPENDENT_AMBULATORY_CARE_PROVIDER_SITE_OTHER): Payer: BC Managed Care – PPO | Admitting: Family Medicine

## 2013-01-13 ENCOUNTER — Encounter: Payer: Self-pay | Admitting: Family Medicine

## 2013-01-13 ENCOUNTER — Ambulatory Visit: Payer: BC Managed Care – PPO | Admitting: Family Medicine

## 2013-01-13 VITALS — BP 156/87 | HR 92 | Ht 71.0 in | Wt 165.0 lb

## 2013-01-13 DIAGNOSIS — M79609 Pain in unspecified limb: Secondary | ICD-10-CM

## 2013-01-13 DIAGNOSIS — M79604 Pain in right leg: Secondary | ICD-10-CM

## 2013-01-13 NOTE — Patient Instructions (Signed)
The distribution and level of your pain fit more with an L3 radiculopathy than meralgia paresthetica. Meralgia paresthetica is confined to the outer thigh, not the front and inner thigh. Treatment for both of these is similar initially anyway. Finish the prednisone course and continue the gabapentin. Call me in 1 week to let me know how you're doing  If completely better (or nearly completely better) I would just see you back in 1 month. If 25-50% improved would start physical therapy for lumbar radiculopathy (pinched nerve in back). If not improving would consider diagnostic injection into lateral femoral cutaneous nerve or an MRI of your back.

## 2013-01-16 ENCOUNTER — Other Ambulatory Visit: Payer: Self-pay

## 2013-01-16 ENCOUNTER — Encounter: Payer: Self-pay | Admitting: Family Medicine

## 2013-01-16 NOTE — Progress Notes (Signed)
Patient ID: Joshua [name], male   DOB: 05-25-1955, 57 y.o.   MRN: [id number]  PCP: No PCP Per Patient  Subjective:   HPI: Patient is a 57 y.o. male here for right sided pain.  Patient reports about 3 months ago he recalls having some right hip weakness associated with running. Recalls 3 weeks ago developing abdominal pain, flank pain, nausea and vomiting. Had a CT scan done showing renal calculi but they were nonobstructive. This largely resolved but he now has pain in anterior thigh wrapping around medial thigh. Pains have a stabbing component to them. Associated with numbness that has been going on for 3-4 weeks anterior thigh to medial as wel. 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  Unsure if this is related. No bowel/bladder dysfunction. No back pain now. Yesterday at Urgent care was placed on gabapentin and prednisone. His pain is currently 1/10 but he is unsure yet if medicines are helping.  Past Medical History  Diagnosis Date  . Hypertension   . Hyperlipidemia   . Allergy   . Asthma     Current Outpatient Prescriptions on File Prior to Visit  Medication Sig Dispense Refill  . albuterol (PROVENTIL HFA;VENTOLIN HFA) 108 (90 BASE) MCG/ACT inhaler Inhale 2 puffs into the lungs every 6 (six) hours as needed.  8.5 g  0  . allopurinol (ZYLOPRIM) 300 MG tablet Take 1 tablet (300 mg total) by mouth daily.  90 tablet  1  . benazepril (LOTENSIN) 10 MG tablet Take 0.5 tablets (5 mg total) by mouth daily.  45 tablet  1  . EPINEPHrine (EPI-PEN) 0.3 mg/0.3 mL DEVI Inject 0.3 mg into the muscle once as needed.      . gabapentin (NEURONTIN) 300 MG capsule Take 1 capsule (300 mg total) by mouth 3 (three) times daily.  90 capsule  0  . hydrochlorothiazide (HYDRODIURIL) 25 MG tablet Take 1 tablet (25 mg total) by mouth daily.  90 tablet  1  . HYDROcodone-acetaminophen (NORCO/VICODIN) 5-325 MG per tablet Take 1 tablet by mouth every 6 (six) hours as needed for pain.  6 tablet  0  . ibuprofen  (ADVIL,MOTRIN) 200 MG tablet Take 600 mg by mouth every 6 (six) hours as needed for pain.      Marland Kitchen ondansetron (ZOFRAN) 4 MG tablet Take 1 tablet (4 mg total) by mouth every 6 (six) hours.  12 tablet  0  . polyethylene glycol powder (GLYCOLAX/MIRALAX) powder Take 17 g by mouth daily.  255 g  0  . PredniSONE 10 MG KIT Take all tabs for each day together with food in the morning.  48 each  0  . rosuvastatin (CRESTOR) 40 MG tablet Take 1 tablet (40 mg total) by mouth daily.  90 tablet  1   No current facility-administered medications on file prior to visit.    Past Surgical History  Procedure Laterality Date  . Kidney stones    . Lithotripsy      Allergies  Allergen Reactions  . Aspirin Anaphylaxis  . Cherry Itching  . Other Itching    almonds  . Shrimp [Shellfish Allergy]     Almond cherry  . Toradol [Ketorolac Tromethamine]   . Benadryl [Diphenhydramine Hcl] Rash    Allergic to the topical only    History   Social History  . Marital Status: Married    Spouse Name: N/A    Number of Children: N/A  . Years of Education: N/A   Occupational History  . Not on  file.   Social History Main Topics  . Smoking status: Never Smoker   . Smokeless tobacco: Not on file  . Alcohol Use: Yes     Comment: Occasional  . Drug Use: No  . Sexual Activity: Yes    Birth Control/ Protection: None   Other Topics Concern  . Not on file   Social History Narrative  . No narrative on file    Family History  Problem Relation Age of Onset  . Hyperlipidemia Mother   . Diabetes Mother   . Alcohol abuse Father   . Alcohol abuse Brother   . Drug abuse Brother   . Alcohol abuse Maternal Grandmother   . Hypertension Maternal Grandfather   . Hypertension Paternal Grandfather   . Cancer Sister   . Cancer Paternal Aunt     BP 156/87  Pulse 92  Ht 5\' 11"  (1.803 m)  Wt 165 lb (74.844 kg)  BMI 23.02 kg/m2  Review of Systems: See HPI above.    Objective:  Physical Exam:  Gen:  NAD  Back/right hip: No gross deformity, scoliosis. No TTP currently.  No midline or bony TTP. FROM low back and hip. Strength LEs 5/5 all muscle groups.  Mild pain with hip flexion, adduction. 2+ MSRs in patellar and achilles tendons, equal bilaterally. Negative SLRs. Sensation diminished anterior right thigh to medial mid-distal thigh. Negative logroll bilateral hips Negative fabers and piriformis stretches.    Assessment & Plan:  1. Right leg pain, numbness - Differential would include an L3 radiculopathy, meralgia paresthetica.  Would favor the former though because of the current distribution and meralgia paresthetica is not usually as painful as what he describes.  He has started appropriate treatment for both conditions - prednisone, gabapentin.  Consider adding physical therapy if he is mildly improved with lumbar radiculopathy protocol.  If not improving would either consider MRI of lumbar spine or trial of u/s guided injection of lateral femoral cutaneous nerve.  Advised to call me in a week to let me know how he's doing.

## 2013-01-17 DIAGNOSIS — M79604 Pain in right leg: Secondary | ICD-10-CM | POA: Insufficient documentation

## 2013-01-17 NOTE — Assessment & Plan Note (Addendum)
Differential would include an L3 radiculopathy, meralgia paresthetica.  Would favor the former though because of the current distribution and meralgia paresthetica is not usually as painful as what he describes and is purely a sensory issue, wouldn't expect pain with hip motions.  He has started appropriate treatment for both conditions - prednisone, gabapentin.  Consider adding physical therapy if he is mildly improved with lumbar radiculopathy protocol.  If not improving would either consider MRI of lumbar spine or trial of u/s guided injection of lateral femoral cutaneous nerve.  Advised to call me in a week to let me know how he's doing.

## 2013-01-20 ENCOUNTER — Telehealth: Payer: Self-pay | Admitting: Family Medicine

## 2013-01-20 NOTE — Telephone Encounter (Signed)
We have two options we discussed - ultrasound guided injection of the lateral femoral cutaneous nerve - I would do that here with ultrasound.  We also talked about an MRI of his lumbar spine.  Either one would be appropriate as a next step - I would favor the injection.

## 2013-01-20 NOTE — Telephone Encounter (Signed)
There are no specific instructions.  Thanks!

## 2013-01-20 NOTE — Telephone Encounter (Signed)
Spoke with patient and advised him of the options that was discussed. The patient would like to do the injection. Have him scheduled for 01-21-13 at 3:30pm. Wants to know if there are any instructions before the appointment.

## 2013-01-21 ENCOUNTER — Ambulatory Visit (INDEPENDENT_AMBULATORY_CARE_PROVIDER_SITE_OTHER): Payer: BC Managed Care – PPO | Admitting: Family Medicine

## 2013-01-21 ENCOUNTER — Encounter: Payer: Self-pay | Admitting: Family Medicine

## 2013-01-21 VITALS — BP 112/72 | HR 83 | Ht 71.0 in | Wt 165.0 lb

## 2013-01-21 DIAGNOSIS — G5711 Meralgia paresthetica, right lower limb: Secondary | ICD-10-CM

## 2013-01-21 DIAGNOSIS — G5710 Meralgia paresthetica, unspecified lower limb: Secondary | ICD-10-CM

## 2013-01-21 DIAGNOSIS — M79604 Pain in right leg: Secondary | ICD-10-CM

## 2013-01-21 DIAGNOSIS — M79609 Pain in unspecified limb: Secondary | ICD-10-CM

## 2013-01-22 ENCOUNTER — Encounter: Payer: Self-pay | Admitting: Family Medicine

## 2013-01-22 NOTE — Assessment & Plan Note (Signed)
Differential would include an L3 radiculopathy, meralgia paresthetica.  Not improving with prednisone, gabapentin over the past week though noticed pain was worse last few days of the prednisone.  Discussed trial of injection of lateral femoral cutaneous nerve under ultrasound guidance vs MRI lumbar spine.  He would like to try injection which was done today.  After informed written consent patient was lying supine on exam table.  Area overlying lateral femoral cutaneous nerve prepped with alcohol swab.  Then using ultrasound guidance, lateral femoral cutaneous nerve identified medial and inferior to ASIS over sartrorius then injected with 3:1 marcaine: depomedrol.  Patient tolerated procedure well without immediate complications.

## 2013-01-22 NOTE — Progress Notes (Addendum)
Patient ID: Brysyn Brandenberger, male   DOB: 1955-03-24, 57 y.o.   MRN: [id number]  PCP: No PCP Per Patient  Subjective:   HPI: Patient is a 57 y.o. male here for right sided pain.  11/3: Patient reports about 3 months ago he recalls having some right hip weakness associated with running. Recalls 3 weeks ago developing abdominal pain, flank pain, nausea and vomiting. Had a CT scan done showing renal calculi but they were nonobstructive. This largely resolved but he now has pain in anterior thigh wrapping around medial thigh. Pains have a stabbing component to them. Associated with numbness that has been going on for 3-4 weeks anterior thigh to medial as wel. 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  Unsure if this is related. No bowel/bladder dysfunction. No back pain now. Yesterday at Urgent care was placed on gabapentin and prednisone. His pain is currently 1/10 but he is unsure yet if medicines are helping.  11/11: Patient comes in today for lateral femoral cutaneous nerve injection. Not improving with prednisone, neurontin.  Past Medical History  Diagnosis Date  . Hypertension   . Hyperlipidemia   . Allergy   . Asthma     Current Outpatient Prescriptions on File Prior to Visit  Medication Sig Dispense Refill  . albuterol (PROVENTIL HFA;VENTOLIN HFA) 108 (90 BASE) MCG/ACT inhaler Inhale 2 puffs into the lungs every 6 (six) hours as needed.  8.5 g  0  . allopurinol (ZYLOPRIM) 300 MG tablet Take 1 tablet (300 mg total) by mouth daily.  90 tablet  1  . benazepril (LOTENSIN) 10 MG tablet Take 0.5 tablets (5 mg total) by mouth daily.  45 tablet  1  . EPINEPHrine (EPI-PEN) 0.3 mg/0.3 mL DEVI Inject 0.3 mg into the muscle once as needed.      . gabapentin (NEURONTIN) 300 MG capsule Take 1 capsule (300 mg total) by mouth 3 (three) times daily.  90 capsule  0  . hydrochlorothiazide (HYDRODIURIL) 25 MG tablet Take 1 tablet (25 mg total) by mouth daily.  90 tablet  1  .  HYDROcodone-acetaminophen (NORCO/VICODIN) 5-325 MG per tablet Take 1 tablet by mouth every 6 (six) hours as needed for pain.  6 tablet  0  . ibuprofen (ADVIL,MOTRIN) 200 MG tablet Take 600 mg by mouth every 6 (six) hours as needed for pain.      Marland Kitchen ondansetron (ZOFRAN) 4 MG tablet Take 1 tablet (4 mg total) by mouth every 6 (six) hours.  12 tablet  0  . polyethylene glycol powder (GLYCOLAX/MIRALAX) powder Take 17 g by mouth daily.  255 g  0  . PredniSONE 10 MG KIT Take all tabs for each day together with food in the morning.  48 each  0  . rosuvastatin (CRESTOR) 40 MG tablet Take 1 tablet (40 mg total) by mouth daily.  90 tablet  1   No current facility-administered medications on file prior to visit.    Past Surgical History  Procedure Laterality Date  . Kidney stones    . Lithotripsy      Allergies  Allergen Reactions  . Aspirin Anaphylaxis  . Cherry Itching  . Other Itching    almonds  . Shrimp [Shellfish Allergy]     Almond cherry  . Toradol [Ketorolac Tromethamine]   . Benadryl [Diphenhydramine Hcl] Rash    Allergic to the topical only    History   Social History  . Marital Status: Married    Spouse Name: N/A  Number of Children: N/A  . Years of Education: N/A   Occupational History  . Not on file.   Social History Main Topics  . Smoking status: Never Smoker   . Smokeless tobacco: Not on file  . Alcohol Use: Yes     Comment: Occasional  . Drug Use: No  . Sexual Activity: Yes    Birth Control/ Protection: None   Other Topics Concern  . Not on file   Social History Narrative  . No narrative on file    Family History  Problem Relation Age of Onset  . Hyperlipidemia Mother   . Diabetes Mother   . Alcohol abuse Father   . Alcohol abuse Brother   . Drug abuse Brother   . Alcohol abuse Maternal Grandmother   . Hypertension Maternal Grandfather   . Hypertension Paternal Grandfather   . Cancer Sister   . Cancer Paternal Aunt     BP 112/72  Pulse 83   Ht 5\' 11"  (1.803 m)  Wt 165 lb (74.844 kg)  BMI 23.02 kg/m2  Review of Systems: See HPI above.    Objective:  Physical Exam:  Gen: NAD Exam not repeated today. Back/right hip: No gross deformity, scoliosis. No TTP currently.  No midline or bony TTP. FROM low back and hip. Strength LEs 5/5 all muscle groups.  Mild pain with hip flexion, adduction. 2+ MSRs in patellar and achilles tendons, equal bilaterally. Negative SLRs. Sensation diminished anterior right thigh to medial mid-distal thigh. Negative logroll bilateral hips Negative fabers and piriformis stretches.    Assessment & Plan:  1. Right leg pain, numbness - Differential would include an L3 radiculopathy, meralgia paresthetica.  Not improving with prednisone, gabapentin over the past week though noticed pain was worse last few days of the prednisone.  Discussed trial of injection of lateral femoral cutaneous nerve under ultrasound guidance vs MRI lumbar spine.  He would like to try injection which was done today.  After informed written consent patient was lying supine on exam table.  Area overlying lateral femoral cutaneous nerve prepped with alcohol swab.  Then using ultrasound guidance, lateral femoral cutaneous nerve identified medial and inferior to ASIS over sartrorius then injected with 3:1 marcaine: depomedrol.  Patient tolerated procedure well without immediate complications.  Addendum:  Patient not improving with injection into lateral femoral cutaneous nerve.  MRI done of lumbar spine which does show disc herniation at L2-3 causing L2 nerve compression.  Believe this is the cause of his current symptoms.  Will move forward with ESI at this level.  Reassess a couple weeks following this but can return as needed if symptoms resolve.

## 2013-01-27 ENCOUNTER — Telehealth: Payer: Self-pay | Admitting: *Deleted

## 2013-01-27 DIAGNOSIS — M545 Low back pain: Secondary | ICD-10-CM

## 2013-01-27 NOTE — Telephone Encounter (Signed)
Injection would have helped with meralgia paresthetica.  Would recommend MRI of lumbar spine to assess for L3 or L4 radiculopathy.

## 2013-01-27 NOTE — Telephone Encounter (Signed)
Patient called and said that they were to call in a week to let us know how they were doing. Patient stated that the injection did not help and the pain level is still the same. Wants to know the next step.

## 2013-01-28 NOTE — Telephone Encounter (Signed)
Order placed

## 2013-01-28 NOTE — Telephone Encounter (Signed)
Spoke with patient and they would like to go ahead with the MRI.

## 2013-02-01 ENCOUNTER — Ambulatory Visit (HOSPITAL_BASED_OUTPATIENT_CLINIC_OR_DEPARTMENT_OTHER): Payer: BC Managed Care – PPO

## 2013-02-03 ENCOUNTER — Telehealth: Payer: Self-pay | Admitting: *Deleted

## 2013-02-03 NOTE — Telephone Encounter (Signed)
Patient called Friday and stated that he was feeling better and was not sure if he should still have the MRI. So the MRI was switched to Tuesday (11-25). Wanted to know your feelings on the MRI since he is feeling better.

## 2013-02-03 NOTE — Telephone Encounter (Signed)
If he is feeling better I would postpone the MRI. If symptoms start to get bad again we can reschedule it.  Thanks!

## 2013-02-04 ENCOUNTER — Ambulatory Visit (HOSPITAL_BASED_OUTPATIENT_CLINIC_OR_DEPARTMENT_OTHER)
Admission: RE | Admit: 2013-02-04 | Discharge: 2013-02-04 | Disposition: A | Discharge status: Home / Self Care | Payer: BC Managed Care – PPO | Source: Ambulatory Visit | Attending: Family Medicine | Admitting: Family Medicine

## 2013-02-04 DIAGNOSIS — M5137 Other intervertebral disc degeneration, lumbosacral region: Secondary | ICD-10-CM | POA: Insufficient documentation

## 2013-02-04 DIAGNOSIS — M545 Low back pain, unspecified: Secondary | ICD-10-CM | POA: Insufficient documentation

## 2013-02-04 DIAGNOSIS — M25559 Pain in unspecified hip: Secondary | ICD-10-CM | POA: Insufficient documentation

## 2013-02-04 DIAGNOSIS — M5126 Other intervertebral disc displacement, lumbar region: Secondary | ICD-10-CM | POA: Insufficient documentation

## 2013-02-04 DIAGNOSIS — M25569 Pain in unspecified knee: Secondary | ICD-10-CM | POA: Insufficient documentation

## 2013-02-04 DIAGNOSIS — M51379 Other intervertebral disc degeneration, lumbosacral region without mention of lumbar back pain or lower extremity pain: Secondary | ICD-10-CM | POA: Insufficient documentation

## 2013-02-12 ENCOUNTER — Other Ambulatory Visit: Payer: Self-pay | Admitting: Family Medicine

## 2013-02-12 DIAGNOSIS — IMO0002 Reserved for concepts with insufficient information to code with codable children: Secondary | ICD-10-CM

## 2013-02-14 ENCOUNTER — Ambulatory Visit
Admission: RE | Admit: 2013-02-14 | Discharge: 2013-02-14 | Disposition: A | Discharge status: Home / Self Care | Payer: BC Managed Care – PPO | Source: Ambulatory Visit | Attending: Family Medicine | Admitting: Family Medicine

## 2013-02-14 VITALS — BP 118/75 | HR 66

## 2013-02-14 DIAGNOSIS — M79604 Pain in right leg: Secondary | ICD-10-CM

## 2013-02-14 DIAGNOSIS — IMO0002 Reserved for concepts with insufficient information to code with codable children: Secondary | ICD-10-CM

## 2013-02-14 MED ORDER — METHYLPREDNISOLONE ACETATE 40 MG/ML INJ SUSP (RADIOLOG
120.0000 mg | Freq: Once | INTRAMUSCULAR | Status: AC
  Administered 2013-02-14: 120 mg via EPIDURAL

## 2013-02-14 MED ORDER — IOHEXOL 180 MG/ML  SOLN
1.0000 mL | Freq: Once | INTRAMUSCULAR | Status: AC | PRN
  Administered 2013-02-14: 1 mL via EPIDURAL

## 2013-02-14 NOTE — Discharge Instructions (Signed)

## 2013-03-25 ENCOUNTER — Encounter: Payer: Self-pay | Admitting: Family Medicine

## 2013-03-28 ENCOUNTER — Encounter: Payer: Self-pay | Admitting: Emergency Medicine

## 2013-04-02 ENCOUNTER — Other Ambulatory Visit: Payer: Self-pay | Admitting: Emergency Medicine

## 2013-04-02 MED ORDER — ATORVASTATIN CALCIUM 40 MG PO TABS: 40.0000 mg | ORAL_TABLET | Freq: Every day | ORAL | Status: DC

## 2013-06-12 ENCOUNTER — Other Ambulatory Visit: Payer: Self-pay | Admitting: Physician Assistant

## 2013-06-12 ENCOUNTER — Other Ambulatory Visit: Payer: Self-pay | Admitting: Family Medicine

## 2013-07-11 ENCOUNTER — Ambulatory Visit: Payer: BC Managed Care – PPO | Admitting: Family Medicine

## 2013-07-11 VITALS — BP 118/78 | HR 64 | Temp 98.6°F | Resp 18 | Ht 70.78 in | Wt 174.8 lb

## 2013-07-11 DIAGNOSIS — Z862 Personal history of diseases of the blood and blood-forming organs and certain disorders involving the immune mechanism: Secondary | ICD-10-CM

## 2013-07-11 DIAGNOSIS — Z8639 Personal history of other endocrine, nutritional and metabolic disease: Secondary | ICD-10-CM

## 2013-07-11 DIAGNOSIS — I10 Essential (primary) hypertension: Secondary | ICD-10-CM

## 2013-07-11 DIAGNOSIS — Z87892 Personal history of anaphylaxis: Secondary | ICD-10-CM

## 2013-07-11 DIAGNOSIS — Z8739 Personal history of other diseases of the musculoskeletal system and connective tissue: Secondary | ICD-10-CM

## 2013-07-11 LAB — BASIC METABOLIC PANEL
BUN: 7 mg/dL (ref 6–23)
CALCIUM: 10 mg/dL (ref 8.4–10.5)
CO2: 29 mEq/L (ref 19–32)
CREATININE: 0.86 mg/dL (ref 0.50–1.35)
Chloride: 101 mEq/L (ref 96–112)
Glucose, Bld: 97 mg/dL (ref 70–99)
Potassium: 4.3 mEq/L (ref 3.5–5.3)
Sodium: 139 mEq/L (ref 135–145)

## 2013-07-11 MED ORDER — EPINEPHRINE 0.3 MG/0.3ML IJ SOAJ: 0.3000 mg | Freq: Once | INTRAMUSCULAR | Status: DC

## 2013-07-11 MED ORDER — ALLOPURINOL 300 MG PO TABS: 300.0000 mg | ORAL_TABLET | Freq: Every day | ORAL | Status: DC

## 2013-07-11 MED ORDER — HYDROCHLOROTHIAZIDE 25 MG PO TABS: 25.0000 mg | ORAL_TABLET | Freq: Every day | ORAL | Status: DC

## 2013-07-11 MED ORDER — BENAZEPRIL HCL 10 MG PO TABS: 5.0000 mg | ORAL_TABLET | Freq: Every day | ORAL | Status: DC

## 2013-07-11 NOTE — Patient Instructions (Signed)
I will be in touch regarding your labs when they come in.  Try splitting your HCTZ in half- let's see if this level of medication will control your blood pressure.  We want you to be 120- 130/ 70- 85 on average.

## 2013-07-11 NOTE — Progress Notes (Signed)
[facility] [address] [place] [address] [phone]  Date:  07/11/2013   Name:  Joshua [name]   DOB:  06/07/1955   MRN:  [id number]  PCP:  No PCP Per Patient    Chief Complaint: Medication Refill   History of Present Illness:  Sidhant Helderman is a 58 y.o. very pleasant male patient who presents with the following:  He is here today for medication refills.  He is feeling well, takes medication for HTN and gout.  His gout has not bothered him in years.   He will have a health screening at work next week- they will check his CHL then.    He notes that his DBP is generally lower 80s to low 70s.   He does not really keep track of his SBP He has been working on getting in shape and thinks his BP may have gone down some  He also needs a RF of his epi- pen to have on hand- shellfish and other food allergy  Wt Readings from Last 3 Encounters:  07/11/13 174 lb 12.8 oz (79.289 kg)  01/21/13 165 lb (74.844 kg)  01/13/13 165 lb (74.844 kg)     Patient Active Problem List   Diagnosis Date Noted  . Right leg pain 01/17/2013  . Meralgia paresthetica of right side 12/25/2012  . Unspecified constipation 12/25/2012  . Seasonal allergic rhinitis 07/20/2012    Past Medical History  Diagnosis Date  . Hypertension   . Hyperlipidemia   . Allergy   . Asthma     Past Surgical History  Procedure Laterality Date  . Kidney stones    . Lithotripsy      History  Substance Use Topics  . Smoking status: Never Smoker   . Smokeless tobacco: Not on file  . Alcohol Use: Yes     Comment: Occasional    Family History  Problem Relation Age of Onset  . Hyperlipidemia Mother   . Diabetes Mother   . Alcohol abuse Father   . Alcohol abuse Brother   . Drug abuse Brother   . Alcohol abuse Maternal Grandmother   . Hypertension Maternal Grandfather   . Hypertension Paternal Grandfather   . Cancer Sister   . Cancer Paternal Aunt     Allergies  Allergen  Reactions  . Aspirin Anaphylaxis  . Shrimp [Shellfish Allergy] Itching and Other (See Comments)    Almond cherry Shrimp causes stomach upset, tingling of tongue and throat sometimes  . Toradol [Ketorolac Tromethamine]   . Benadryl [Diphenhydramine Hcl] Rash    Allergic to the topical only  . Cherry Itching  . Other Itching    almonds    Medication list has been reviewed and updated.  Current Outpatient Prescriptions on File Prior to Visit  Medication Sig Dispense Refill  . albuterol (PROVENTIL HFA;VENTOLIN HFA) 108 (90 BASE) MCG/ACT inhaler Inhale 2 puffs into the lungs every 6 (six) hours as needed.  8.5 g  0  . allopurinol (ZYLOPRIM) 300 MG tablet Take 1 tablet (300 mg total) by mouth daily. PATIENT NEEDS OFFICE VISIT FOR ADDITIONAL REFILLS  30 tablet  0  . atorvastatin (LIPITOR) 40 MG tablet Take 1 tablet (40 mg total) by mouth daily.  90 tablet  1  . benazepril (LOTENSIN) 10 MG tablet Take 0.5 tablets (5 mg total) by mouth daily.  45 tablet  1  . EPINEPHrine (EPI-PEN) 0.3 mg/0.3 mL DEVI Inject 0.3 mg into the muscle once as needed.      Marland Kitchen  hydrochlorothiazide (HYDRODIURIL) 25 MG tablet Take 1 tablet (25 mg total) by mouth daily. PATIENT NEEDS OFFICE VISIT FOR ADDITIONAL REFILLS  30 tablet  0  . benazepril (LOTENSIN) 10 MG tablet Take 0.5 tablets (5 mg total) by mouth daily. PATIENT NEEDS OFFICE VISIT FOR ADDITIONAL REFILLS  15 tablet  0  . gabapentin (NEURONTIN) 300 MG capsule Take 1 capsule (300 mg total) by mouth 3 (three) times daily.  90 capsule  0  . HYDROcodone-acetaminophen (NORCO/VICODIN) 5-325 MG per tablet Take 1 tablet by mouth every 6 (six) hours as needed for pain.  6 tablet  0  . ibuprofen (ADVIL,MOTRIN) 200 MG tablet Take 600 mg by mouth every 6 (six) hours as needed for pain.      Marland Kitchen ondansetron (ZOFRAN) 4 MG tablet Take 1 tablet (4 mg total) by mouth every 6 (six) hours.  12 tablet  0  . polyethylene glycol powder (GLYCOLAX/MIRALAX) powder Take 17 g by mouth daily.  255  g  0  . PredniSONE 10 MG KIT Take all tabs for each day together with food in the morning.  48 each  0  . rosuvastatin (CRESTOR) 40 MG tablet Take 1 tablet (40 mg total) by mouth daily.  90 tablet  1   No current facility-administered medications on file prior to visit.    Review of Systems:  As per HPI- otherwise negative.   Physical Examination: Filed Vitals:   07/11/13 1144  BP: 118/78  Pulse: 64  Temp: 98.6 F (37 C)  Resp: 18   Filed Vitals:   07/11/13 1144  Height: 5' 10.78" (1.798 m)  Weight: 174 lb 12.8 oz (79.289 kg)   Body mass index is 24.53 kg/(m^2). Ideal Body Weight: Weight in (lb) to have BMI = 25: 177.8  GEN: WDWN, NAD, Non-toxic, A & O x 3, fit build, looks well HEENT: Atraumatic, Normocephalic. Neck supple. No masses, No LAD. Ears and Nose: No external deformity. CV: RRR, No M/G/R. No JVD. No thrill. No extra heart sounds. PULM: CTA B, no wheezes, crackles, rhonchi. No retractions. No resp. distress. No accessory muscle use. EXTR: No c/c/e NEURO Normal gait.  PSYCH: Normally interactive. Conversant. Not depressed or anxious appearing.  Calm demeanor.    Assessment and Plan: History of anaphylaxis - Plan: EPINEPHrine (EPIPEN) 0.3 mg/0.3 mL IJ SOAJ injection  HTN (hypertension) - Plan: benazepril (LOTENSIN) 10 MG tablet, benazepril (LOTENSIN) 10 MG tablet, hydrochlorothiazide (HYDRODIURIL) 25 MG tablet, Basic metabolic panel  History of gout - Plan: allopurinol (ZYLOPRIM) 300 MG tablet  BP is well controlled; check BMP, but he can likely go down to 1/2 of his HCTZ at 12.5 mg.   He will continue to monitor his BP and go back up to 25 mg if needed  Meds ordered this encounter  Medications  . benazepril (LOTENSIN) 10 MG tablet    Sig: Take 0.5 tablets (5 mg total) by mouth daily.    Dispense:  15 tablet    Refill:  1  . EPINEPHrine (EPIPEN) 0.3 mg/0.3 mL IJ SOAJ injection    Sig: Inject 0.3 mLs (0.3 mg total) into the muscle once.    Dispense:  2  Device    Refill:  2  . allopurinol (ZYLOPRIM) 300 MG tablet    Sig: Take 1 tablet (300 mg total) by mouth daily.    Dispense:  90 tablet    Refill:  3  . benazepril (LOTENSIN) 10 MG tablet    Sig: Take 0.5 tablets (5 mg  total) by mouth daily.    Dispense:  45 tablet    Refill:  3  . hydrochlorothiazide (HYDRODIURIL) 25 MG tablet    Sig: Take 1 tablet (25 mg total) by mouth daily.    Dispense:  90 tablet    Refill:  3     Signed Lamar Blinks, MD

## 2013-07-20 ENCOUNTER — Ambulatory Visit: Payer: BC Managed Care – PPO | Admitting: Family Medicine

## 2013-07-20 VITALS — BP 118/70 | HR 68 | Temp 98.9°F | Resp 16 | Ht 71.0 in | Wt 176.0 lb

## 2013-07-20 DIAGNOSIS — L0390 Cellulitis, unspecified: Secondary | ICD-10-CM

## 2013-07-20 DIAGNOSIS — L0291 Cutaneous abscess, unspecified: Secondary | ICD-10-CM

## 2013-07-20 DIAGNOSIS — M25569 Pain in unspecified knee: Secondary | ICD-10-CM

## 2013-07-20 MED ORDER — CEFTRIAXONE SODIUM 1 G IJ SOLR
1.0000 g | Freq: Once | INTRAMUSCULAR | Status: AC
  Administered 2013-07-20: 1 g via INTRAMUSCULAR

## 2013-07-20 MED ORDER — DOXYCYCLINE HYCLATE 100 MG PO TABS: 100.0000 mg | ORAL_TABLET | Freq: Two times a day (BID) | ORAL | Status: DC

## 2013-07-20 NOTE — Patient Instructions (Signed)
Cellulitis Cellulitis is an infection of the skin and the tissue beneath it. The infected area is usually red and tender. Cellulitis occurs most often in the arms and lower legs.  CAUSES  Cellulitis is caused by bacteria that enter the skin through cracks or cuts in the skin. The most common types of bacteria that cause cellulitis are Staphylococcus and Streptococcus. SYMPTOMS   Redness and warmth.  Swelling.  Tenderness or pain.  Fever. DIAGNOSIS  Your caregiver can usually determine what is wrong based on a physical exam. Blood tests may also be done. TREATMENT  Treatment usually involves taking an antibiotic medicine. HOME CARE INSTRUCTIONS   Take your antibiotics as directed. Finish them even if you start to feel better.  Keep the infected arm or leg elevated to reduce swelling.  Apply a warm cloth to the affected area up to 4 times per day to relieve pain.  Only take over-the-counter or prescription medicines for pain, discomfort, or fever as directed by your caregiver.  Keep all follow-up appointments as directed by your caregiver. SEEK MEDICAL CARE IF:   You notice red streaks coming from the infected area.  Your red area gets larger or turns dark in color.  Your bone or joint underneath the infected area becomes painful after the skin has healed.  Your infection returns in the same area or another area.  You notice a swollen bump in the infected area.  You develop new symptoms. SEEK IMMEDIATE MEDICAL CARE IF:   You have a fever.  You feel very sleepy.  You develop vomiting or diarrhea.  You have a general ill feeling (malaise) with muscle aches and pains. MAKE SURE YOU:   Understand these instructions.  Will watch your condition.  Will get help right away if you are not doing well or get worse. Document Released: 12/07/2004 Document Revised: 08/29/2011 Document Reviewed: 05/15/2011 [employer] Patient Information 2014 [employer].  

## 2013-07-20 NOTE — Progress Notes (Signed)
Chief Complaint:  Chief Complaint  Patient presents with  . Leg Injury    left leg/knee injury, hit on piece of wood, red, swollen    HPI: Joshua [name] is a 58 y.o. 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 + warmth, ertyhema, swelling. No fevers or chills. Increas swelling and redness. No h.o skin infection in the past. He is UTD on his tetanus. Has not tried anything for this except soap and water. He states he got the small piece of wood out and flushed it with water.   Past Medical History  Diagnosis Date  . Hypertension   . Hyperlipidemia   . Allergy   . Asthma    Past Surgical History  Procedure Laterality Date  . Kidney stones    . Lithotripsy     History   Social History  . Marital Status: Married    Spouse Name: N/A    Number of Children: N/A  . Years of Education: N/A   Social History Main Topics  . Smoking status: Never Smoker   . Smokeless tobacco: None  . Alcohol Use: Yes     Comment: Occasional  . Drug Use: No  . Sexual Activity: Yes    Birth Control/ Protection: None   Other Topics Concern  . None   Social History Narrative  . None   Family History  Problem Relation Age of Onset  . Hyperlipidemia Mother   . Diabetes Mother   . Alcohol abuse Father   . Alcohol abuse Brother   . Drug abuse Brother   . Alcohol abuse Maternal Grandmother   . Hypertension Maternal Grandfather   . Hypertension Paternal Grandfather   . Cancer Sister   . Cancer Paternal Aunt    Allergies  Allergen Reactions  . Aspirin Anaphylaxis  . Shrimp [Shellfish Allergy] Itching and Other (See Comments)    Almond cherry Shrimp causes stomach upset, tingling of tongue and throat sometimes  . Benadryl [Diphenhydramine Hcl] Rash    Allergic to the topical only  . Cherry  Itching  . Other Itching    almonds   Prior to Admission medications   Medication Sig Start Date End Date Taking? Authorizing Provider  albuterol (PROVENTIL HFA;VENTOLIN HFA) 108 (90 BASE) MCG/ACT inhaler Inhale 2 puffs into the lungs every 6 (six) hours as needed. 12/23/12  Yes Alveda Reasons, MD  allopurinol (ZYLOPRIM) 300 MG tablet Take 1 tablet (300 mg total) by mouth daily. 07/11/13  Yes Gay Filler Copland, MD  atorvastatin (LIPITOR) 40 MG tablet Take 1 tablet (40 mg total) by mouth daily. 04/02/13  Yes Ellison Carwin, MD  benazepril (LOTENSIN) 10 MG tablet Take 0.5 tablets (5 mg total) by mouth daily. 07/11/13  Yes Gay Filler Copland, MD  EPINEPHrine (EPI-PEN) 0.3 mg/0.3 mL DEVI Inject 0.3 mg into the muscle once as needed.   Yes Historical Provider, MD  EPINEPHrine (EPIPEN) 0.3 mg/0.3 mL IJ SOAJ injection Inject 0.3 mLs (0.3 mg total) into the muscle once. 07/11/13  Yes Gay Filler Copland, MD  hydrochlorothiazide (HYDRODIURIL) 25 MG tablet Take 1 tablet (25 mg total) by mouth daily. 07/11/13  Yes Gay Filler Copland, MD  benazepril (LOTENSIN) 10 MG tablet Take 0.5 tablets (5  mg total) by mouth daily. 07/11/13   Darreld Mclean, MD  HYDROcodone-acetaminophen (NORCO/VICODIN) 5-325 MG per tablet Take 1 tablet by mouth every 6 (six) hours as needed for pain. 12/23/12   Garald Balding, NP  ibuprofen (ADVIL,MOTRIN) 200 MG tablet Take 600 mg by mouth every 6 (six) hours as needed for pain.    Historical Provider, MD  ondansetron (ZOFRAN) 4 MG tablet Take 1 tablet (4 mg total) by mouth every 6 (six) hours. 12/23/12   Garald Balding, NP  polyethylene glycol powder (GLYCOLAX/MIRALAX) powder Take 17 g by mouth daily. 12/23/12   Garald Balding, NP     ROS: The patient denies fevers, chills, night sweats, unintentional weight loss, chest pain, palpitations, wheezing, dyspnea on exertion, nausea, vomiting, abdominal pain, dysuria, hematuria, melena, numbness, weakness, or tingling.   All other systems have been  reviewed and were otherwise negative with the exception of those mentioned in the HPI and as above.    PHYSICAL EXAM: Filed Vitals:   07/20/13 1541  BP: 118/70  Pulse: 68  Temp: 98.9 F (37.2 C)  Resp: 16   Filed Vitals:   07/20/13 1541  Height: 5\' 11"  (1.803 m)  Weight: 176 lb (79.833 kg)   Body mass index is 24.56 kg/(m^2).  General: Alert, no acute distress HEENT:  Normocephalic, atraumatic, oropharynx patent. EOMI, PERRLA Cardiovascular:  Regular rate and rhythm, no rubs murmurs or gallops.  No Carotid bruits, radial pulse intact. No pedal edema.  Respiratory: Clear to auscultation bilaterally.  No wheezes, rales, or rhonchi.  No cyanosis, no use of accessory musculature GI: No organomegaly, abdomen is soft and non-tender, positive bowel sounds.  No masses. Skin: + cellulitis rash left distal femur. Erythema, nonfluctuant, dry scab Neurologic: Facial musculature symmetric. Psychiatric: Patient is appropriate throughout our interaction. Lymphatic: No cervical lymphadenopathy Musculoskeletal: Gait intact.   LABS: Results for orders placed in visit on 49/70/26  BASIC METABOLIC PANEL      Result Value Ref Range   Sodium 139  135 - 145 mEq/L   Potassium 4.3  3.5 - 5.3 mEq/L   Chloride 101  96 - 112 mEq/L   CO2 29  19 - 32 mEq/L   Glucose, Bld 97  70 - 99 mg/dL   BUN 7  6 - 23 mg/dL   Creat 0.86  0.50 - 1.35 mg/dL   Calcium 10.0  8.4 - 10.5 mg/dL     EKG/XRAY:   Primary read interpreted by Dr. Marin Comment at [facility].   ASSESSMENT/PLAN: Encounter Diagnoses  Name Primary?  . Cellulitis Yes  . Pain in joint, lower leg    Received 1 gram Rocephin in office Rx  Doxycyline 100 mg BID x 10 days Precautions given for worsening sxs  , skin marked F/u rpn  Gross sideeffects, risk and benefits, and alternatives of medications d/w patient. Patient is aware that all medications have potential sideeffects and we are unable to predict every sideeffect or drug-drug interaction that may  occur.  Glenford Bayley, DO 07/22/2013 12:20 PM

## 2013-10-19 ENCOUNTER — Telehealth: Payer: Self-pay | Admitting: Radiology

## 2013-10-19 ENCOUNTER — Other Ambulatory Visit: Payer: Self-pay | Admitting: Emergency Medicine

## 2013-10-19 MED ORDER — ATORVASTATIN CALCIUM 40 MG PO TABS: 40.0000 mg | ORAL_TABLET | Freq: Every day | ORAL | Status: DC

## 2013-10-19 NOTE — Telephone Encounter (Signed)
Mychart message sent.

## 2013-10-24 ENCOUNTER — Telehealth: Payer: Self-pay

## 2013-10-24 MED ORDER — ATORVASTATIN CALCIUM 40 MG PO TABS: 40.0000 mg | ORAL_TABLET | Freq: Every day | ORAL | Status: DC

## 2013-10-24 NOTE — Telephone Encounter (Signed)
Advised pt that he does need to come in for an OV.  Advised pt he needed fasting labs. Pt will RTC for an OV- sent in 30 day supply to cover pt until appt.

## 2013-10-24 NOTE — Telephone Encounter (Signed)
Pt needs refill of LIPITOR. He received a message via [facility] that he is overdue for an office visit. He thinks this is a mistake because he was here in may of this year. He wants to know if he HAS to come in for a visit to get this refill.

## 2013-11-28 ENCOUNTER — Encounter: Payer: Self-pay | Admitting: Gastroenterology

## 2013-12-02 ENCOUNTER — Ambulatory Visit (INDEPENDENT_AMBULATORY_CARE_PROVIDER_SITE_OTHER): Payer: BC Managed Care – PPO | Admitting: Internal Medicine

## 2013-12-02 VITALS — BP 114/70 | HR 64 | Temp 97.7°F | Resp 16 | Ht 71.0 in | Wt 166.2 lb

## 2013-12-02 DIAGNOSIS — E785 Hyperlipidemia, unspecified: Secondary | ICD-10-CM

## 2013-12-02 DIAGNOSIS — M109 Gout, unspecified: Secondary | ICD-10-CM | POA: Insufficient documentation

## 2013-12-02 DIAGNOSIS — Z7189 Other specified counseling: Secondary | ICD-10-CM

## 2013-12-02 DIAGNOSIS — I10 Essential (primary) hypertension: Secondary | ICD-10-CM | POA: Insufficient documentation

## 2013-12-02 LAB — COMPLETE METABOLIC PANEL WITH GFR
ALT: 42 U/L (ref 0–53)
AST: 33 U/L (ref 0–37)
Albumin: 5 g/dL (ref 3.5–5.2)
Alkaline Phosphatase: 105 U/L (ref 39–117)
BUN: 10 mg/dL (ref 6–23)
CALCIUM: 9.9 mg/dL (ref 8.4–10.5)
CHLORIDE: 103 meq/L (ref 96–112)
CO2: 27 mEq/L (ref 19–32)
Creat: 0.86 mg/dL (ref 0.50–1.35)
GFR, Est Non African American: >89 mL/min
Glucose, Bld: 96 mg/dL (ref 70–99)
Potassium: 4.1 mEq/L (ref 3.5–5.3)
Sodium: 138 mEq/L (ref 135–145)
Total Bilirubin: 0.9 mg/dL (ref 0.2–1.2)
Total Protein: 7.4 g/dL (ref 6.0–8.3)

## 2013-12-02 LAB — POCT CBC
Granulocyte percent: 56.7 %G (ref 37–80)
HEMATOCRIT: 47.4 % (ref 43.5–53.7)
HEMOGLOBIN: 15.8 g/dL (ref 14.1–18.1)
LYMPH, POC: 2.8 (ref 0.6–3.4)
MCH: 29.1 pg (ref 27–31.2)
MCHC: 33.3 g/dL (ref 31.8–35.4)
MCV: 87.4 fL (ref 80–97)
MID (cbc): 0.3 (ref 0–0.9)
MPV: 8.6 fL (ref 0–99.8)
POC GRANULOCYTE: 4 (ref 2–6.9)
POC LYMPH PERCENT: 39.1 %L (ref 10–50)
POC MID %: 4.2 %M (ref 0–12)
Platelet Count, POC: 202 10*3/uL (ref 142–424)
RBC: 5.43 M/uL (ref 4.69–6.13)
RDW, POC: 13.3 %
WBC: 7.1 10*3/uL (ref 4.6–10.2)

## 2013-12-02 LAB — POCT UA - MICROSCOPIC ONLY
BACTERIA, U MICROSCOPIC: NEGATIVE
CASTS, UR, LPF, POC: NEGATIVE
Crystals, Ur, HPF, POC: NEGATIVE
Epithelial cells, urine per micros: NEGATIVE
RBC, urine, microscopic: NEGATIVE
WBC, Ur, HPF, POC: NEGATIVE
Yeast, UA: NEGATIVE

## 2013-12-02 LAB — POCT URINALYSIS DIPSTICK
BILIRUBIN UA: NEGATIVE
Blood, UA: NEGATIVE
Glucose, UA: NEGATIVE
KETONES UA: NEGATIVE
LEUKOCYTES UA: NEGATIVE
Nitrite, UA: NEGATIVE
PH UA: 6.5
Protein, UA: NEGATIVE
SPEC GRAV UA: 1.015
Urobilinogen, UA: 0.2

## 2013-12-02 LAB — TSH: TSH: 1.903 u[IU]/mL (ref 0.350–4.500)

## 2013-12-02 MED ORDER — ATORVASTATIN CALCIUM 40 MG PO TABS: 40.0000 mg | ORAL_TABLET | Freq: Every day | ORAL | Status: DC

## 2013-12-02 NOTE — Patient Instructions (Signed)
Immunization Schedule, Adult  Influenza vaccine.  All adults should be immunized every year.  All adults, including pregnant women and people with hives-only allergy to eggs can receive the inactivated influenza (IIV) vaccine.  Adults aged 57-49 years can receive the recombinant influenza (RIV) vaccine. The RIV vaccine does not contain any egg protein.  Adults aged 76 years or older can receive the standard-dose IIV or the high-dose IIV.  Tetanus, diphtheria, and acellular pertussis (Td, Tdap) vaccine.  Pregnant women should receive 1 dose of Tdap vaccine during each pregnancy. The dose should be obtained regardless of the length of time since the last dose. Immunization is preferred during the 27th to 36th week of gestation.  An adult who has not previously received Tdap or who does not know his or her vaccine status should receive 1 dose of Tdap. This initial dose should be followed by tetanus and diphtheria toxoids (Td) booster doses every 10 years.  Adults with an unknown or incomplete history of completing a 3-dose immunization series with Td-containing vaccines should begin or complete a primary immunization series including a Tdap dose.  Adults should receive a Td booster every 10 years.  Varicella vaccine.  An adult without evidence of immunity to varicella should receive 2 doses or a second dose if he or she has previously received 1 dose.  Pregnant females who do not have evidence of immunity should receive the first dose after pregnancy. This first dose should be obtained before leaving the health care facility. The second dose should be obtained 4-8 weeks after the first dose.  Human papillomavirus (HPV) vaccine.  Females aged 13-26 years who have not received the vaccine previously should obtain the 3-dose series.  The vaccine is not recommended for use in pregnant females. However, pregnancy testing is not needed before receiving a dose. If a male is found to be  pregnant after receiving a dose, no treatment is needed. In that case, the remaining doses should be delayed until after the pregnancy.  Males aged 37-21 years who have not received the vaccine previously should receive the 3-dose series. Males aged 22-26 years may be immunized.  Immunization is recommended through the age of 93 years for any male who has sex with males and did not get any or all doses earlier.  Immunization is recommended for any person with an immunocompromised condition through the age of 71 years if he or she did not get any or all doses earlier.  During the 3-dose series, the second dose should be obtained 4-8 weeks after the first dose. The third dose should be obtained 24 weeks after the first dose and 16 weeks after the second dose.  Zoster vaccine.  One dose is recommended for adults aged 73 years or older unless certain conditions are present.  Measles, mumps, and rubella (MMR) vaccine.  Adults born before 35 generally are considered immune to measles and mumps.  Adults born in [birth year] or later should have 1 or more doses of MMR vaccine unless there is a contraindication to the vaccine or there is laboratory evidence of immunity to each of the three diseases.  A routine second dose of MMR vaccine should be obtained at least 28 days after the first dose for students attending postsecondary schools, health care workers, or international travelers.  People who received inactivated measles vaccine or an unknown type of measles vaccine during 1963-1967 should receive 2 doses of MMR vaccine.  People who received inactivated mumps vaccine or an unknown type  of mumps vaccine before 1979 and are at high risk for mumps infection should consider immunization with 2 doses of MMR vaccine.  For females of childbearing age, rubella immunity should be determined. If there is no evidence of immunity, females who are not pregnant should be vaccinated. If there is no evidence of  immunity, females who are pregnant should delay immunization until after pregnancy.  Unvaccinated health care workers born before 1957 who lack laboratory evidence of measles, mumps, or rubella immunity or laboratory confirmation of disease should consider measles and mumps immunization with 2 doses of MMR vaccine or rubella immunization with 1 dose of MMR vaccine.  Pneumococcal 13-valent conjugate (PCV13) vaccine.  When indicated, a person who is uncertain of his or her immunization history and has no record of immunization should receive the PCV13 vaccine.  An adult aged 19 years or older who has certain medical conditions and has not been previously immunized should receive 1 dose of PCV13 vaccine. This PCV13 should be followed with a dose of pneumococcal polysaccharide (PPSV23) vaccine. The PPSV23 vaccine dose should be obtained at least 8 weeks after the dose of PCV13 vaccine.  An adult aged 19 years or older who has certain medical conditions and previously received 1 or more doses of PPSV23 vaccine should receive 1 dose of PCV13. The PCV13 vaccine dose should be obtained 1 or more years after the last PPSV23 vaccine dose.  Pneumococcal polysaccharide (PPSV23) vaccine.  When PCV13 is also indicated, PCV13 should be obtained first.  All adults aged 65 years and older should be immunized.  An adult younger than age 65 years who has certain medical conditions should be immunized.  Any person who resides in a nursing home or long-term care facility should be immunized.  An adult smoker should be immunized.  People with an immunocompromised condition and certain other conditions should receive both PCV13 and PPSV23 vaccines.  People with human immunodeficiency virus (HIV) infection should be immunized as soon as possible after diagnosis.  Immunization during chemotherapy or radiation therapy should be avoided.  Routine use of PPSV23 vaccine is not recommended for American Indians,  [place] Natives, or people younger than 65 years unless there are medical conditions that require PPSV23 vaccine.  When indicated, people who have unknown immunization and have no record of immunization should receive PPSV23 vaccine.  One-time revaccination 5 years after the first dose of PPSV23 is recommended for people aged 19-64 years who have chronic kidney failure, nephrotic syndrome, asplenia, or immunocompromised conditions.  People who received 1-2 doses of PPSV23 before age 65 years should receive another dose of PPSV23 vaccine at age 65 years or later if at least 5 years have passed since the previous dose.  Doses of PPSV23 are not needed for people immunized with PPSV23 at or after age 65 years.  Meningococcal vaccine.  Adults with asplenia or persistent complement component deficiencies should receive 2 doses of quadrivalent meningococcal conjugate (MenACWY-D) vaccine. The doses should be obtained at least 2 months apart.  Microbiologists working with certain meningococcal bacteria, military recruits, people at risk during an outbreak, and people who travel to or live in countries with a high rate of meningitis should be immunized.  A first-year college student up through age 21 years who is living in a residence hall should receive a dose if he or she did not receive a dose on or after his or her 16th birthday.  Adults who have certain high-risk conditions should receive one or more doses   of vaccine.  Hepatitis A vaccine.  Adults who wish to be protected from this disease, have certain high-risk conditions, work with hepatitis A-infected animals, work in hepatitis A research labs, or travel to or work in countries with a high rate of hepatitis A should be immunized.  Adults who were previously unvaccinated and who anticipate close contact with an international adoptee during the first 60 days after arrival in the [place] from a country with a high rate of hepatitis A should  be immunized.  Hepatitis B vaccine.  Adults who wish to be protected from this disease, have certain high-risk conditions, may be exposed to blood or other infectious body fluids, are household contacts or sex partners of hepatitis B positive people, are clients or workers in certain care facilities, or travel to or work in countries with a high rate of hepatitis B should be immunized.  Haemophilus influenzae type b (Hib) vaccine.  A previously unvaccinated person with asplenia or sickle cell disease or having a scheduled splenectomy should receive 1 dose of Hib vaccine.  Regardless of previous immunization, a recipient of a hematopoietic stem cell transplant should receive a 3-dose series 6-12 months after his or her successful transplant.  Hib vaccine is not recommended for adults with HIV infection. Document Released: 05/20/2003 Document Revised: 06/24/2012 Document Reviewed: 04/16/2012 Exit[employer] Patient Information 2015 [employer]. This information is not intended to replace advice given to you by your health care provider. Make sure you discuss any questions you have with your health care provider.  

## 2013-12-02 NOTE — Progress Notes (Signed)
   Subjective:    Patient ID: Joshua [name], male    DOB: 1955/06/20, 58 y.o.   MRN: [id number]  HPI    Review of Systems     Objective:   Physical Exam        Assessment & Plan:

## 2013-12-02 NOTE — Progress Notes (Signed)
   Subjective:    Patient ID: Joshua [name], male    DOB: 05-10-1955, 58 y.o.   MRN: [id number]  HPI  58 year old caucasian male presents to clinic for refill of Atorvastatin/Lipitor 40mg  tablets - one per day. Has htn, gout , lipid disorder. Has lost weight exercising, running. Feels great. Dr. Darron Doom was his primary care.       Review of Systems     Objective:   Physical Exam  Constitutional: He is oriented to person, place, and time. He appears well-developed and well-nourished.  HENT:  Head: Normocephalic.  Eyes: EOM are normal. No scleral icterus.  Neck: Normal range of motion.  Cardiovascular: Normal rate, regular rhythm and normal heart sounds.   Pulmonary/Chest: Effort normal and breath sounds normal.  Abdominal: Soft. There is no tenderness.  Musculoskeletal: Normal range of motion.  Neurological: He is alert and oriented to person, place, and time. He exhibits normal muscle tone. Coordination normal.  Psychiatric: He has a normal mood and affect. His behavior is normal. Judgment and thought content normal.          Assessment & Plan:  Refill meds 6 months Get cpe with new primary care.

## 2013-12-03 LAB — PSA: PSA: 1.07 ng/mL (ref <=4.00)

## 2013-12-05 ENCOUNTER — Encounter: Payer: Self-pay | Admitting: *Deleted

## 2013-12-29 ENCOUNTER — Ambulatory Visit (INDEPENDENT_AMBULATORY_CARE_PROVIDER_SITE_OTHER): Payer: BC Managed Care – PPO | Admitting: Family Medicine

## 2013-12-29 ENCOUNTER — Encounter: Payer: Self-pay | Admitting: Family Medicine

## 2013-12-29 VITALS — BP 100/64 | HR 58 | Temp 97.2°F | Resp 16 | Ht 71.0 in | Wt 165.2 lb

## 2013-12-29 DIAGNOSIS — E785 Hyperlipidemia, unspecified: Secondary | ICD-10-CM

## 2013-12-29 DIAGNOSIS — Z0000 Encounter for general adult medical examination without abnormal findings: Secondary | ICD-10-CM

## 2013-12-29 DIAGNOSIS — I10 Essential (primary) hypertension: Secondary | ICD-10-CM

## 2013-12-29 DIAGNOSIS — Z131 Encounter for screening for diabetes mellitus: Secondary | ICD-10-CM

## 2013-12-29 LAB — LIPID PANEL
Cholesterol: 129 mg/dL (ref 0–200)
HDL: 53 mg/dL (ref >39)
LDL Cholesterol: 62 mg/dL (ref 0–99)
Total CHOL/HDL Ratio: 2.4 Ratio
Triglycerides: 72 mg/dL (ref <150)
VLDL: 14 mg/dL (ref 0–40)

## 2013-12-29 LAB — HEMOGLOBIN A1C
HEMOGLOBIN A1C: 5.8 % — AB (ref <5.7)
MEAN PLASMA GLUCOSE: 120 mg/dL — AB (ref <117)

## 2013-12-29 NOTE — Patient Instructions (Signed)
It looks like you are doing great.  Continue to exercise and watch your weight.  I will be in touch with your labs asap.  Otherwise it looks like you are up to date on your health maintenance.  Please come and see me in the spring for a follow-up.

## 2013-12-29 NOTE — Progress Notes (Signed)
[facility] [address] [place] [address] [phone]  Date:  12/29/2013   Name:  Joshua [name]   DOB:  December 26, 1955   MRN:  [id number]  PCP:  No PCP Per Patient    Chief Complaint: Annual Exam   History of Present Illness:  Joshua [name] is a 58 y.o. very pleasant male patient who presents with the following:  Here today for a CPE.  History of HTN, gout and anaphylaxis.  He is doing well.  No issues with gout, he uses his allopurinol daily.   He is fasting today for labs. He has noted that his glucose might be a little bit elevated after fasting but has not been dx with diabetes. He had a PSA, metabolic profile, CBC, TSH last month  He very occasionally uses albuterol Flu shot last week  He is working on running for exercise and has lost some weight on purpose  Wt Readings from Last 3 Encounters:  12/29/13 165 lb 3.2 oz (74.934 kg)  12/02/13 166 lb 3.2 oz (75.388 kg)  07/20/13 176 lb (79.833 kg)   He wonders if he should try to get back to his "running weight" from years ago, when he weighed about 150 lbs.   He has also noted that his BP is running 110- 120/ 70s and wonders if he might be able to scale back on his BP medications   Patient Active Problem List   Diagnosis Date Noted  . Other and unspecified hyperlipidemia 12/02/2013  . HTN (hypertension) 12/02/2013  . Gout 12/02/2013  . Meralgia paresthetica of right side 12/25/2012  . Seasonal allergic rhinitis 07/20/2012    Past Medical History  Diagnosis Date  . Hypertension   . Hyperlipidemia   . Allergy   . Asthma     Past Surgical History  Procedure Laterality Date  . Kidney stones    . Lithotripsy      History  Substance Use Topics  . Smoking status: Never Smoker   . Smokeless tobacco: Not on file  . Alcohol Use: Yes     Comment: Occasional    Family History  Problem Relation Age of Onset  . Hyperlipidemia Mother   . Diabetes Mother   . Alcohol abuse Father   .  Alcohol abuse Brother   . Drug abuse Brother   . Alcohol abuse Maternal Grandmother   . Hypertension Maternal Grandfather   . Hypertension Paternal Grandfather   . Cancer Sister   . Cancer Paternal Aunt     Allergies  Allergen Reactions  . Aspirin Anaphylaxis  . Shrimp [Shellfish Allergy] Itching and Other (See Comments)    Almond cherry Shrimp causes stomach upset, tingling of tongue and throat sometimes  . Benadryl [Diphenhydramine Hcl] Rash    Allergic to the topical only  . Cherry Itching  . Other Itching    almonds    Medication list has been reviewed and updated.  Current Outpatient Prescriptions on File Prior to Visit  Medication Sig Dispense Refill  . allopurinol (ZYLOPRIM) 300 MG tablet Take 1 tablet (300 mg total) by mouth daily.  90 tablet  3  . atorvastatin (LIPITOR) 40 MG tablet Take 1 tablet (40 mg total) by mouth daily.  90 tablet  1  . benazepril (LOTENSIN) 10 MG tablet Take 0.5 tablets (5 mg total) by mouth daily.  15 tablet  1  . benazepril (LOTENSIN) 10 MG tablet Take 0.5 tablets (5 mg total) by mouth daily.  [place]  tablet  3  . hydrochlorothiazide (HYDRODIURIL) 25 MG tablet Take 1 tablet (25 mg total) by mouth daily.  90 tablet  3  . albuterol (PROVENTIL HFA;VENTOLIN HFA) 108 (90 BASE) MCG/ACT inhaler Inhale 2 puffs into the lungs every 6 (six) hours as needed.  8.5 g  0  . doxycycline (VIBRA-TABS) 100 MG tablet Take 1 tablet (100 mg total) by mouth 2 (two) times daily.  20 tablet  0  . EPINEPHrine (EPI-PEN) 0.3 mg/0.3 mL DEVI Inject 0.3 mg into the muscle once as needed.      Marland Kitchen EPINEPHrine (EPIPEN) 0.3 mg/0.3 mL IJ SOAJ injection Inject 0.3 mLs (0.3 mg total) into the muscle once.  2 Device  2  . HYDROcodone-acetaminophen (NORCO/VICODIN) 5-325 MG per tablet Take 1 tablet by mouth every 6 (six) hours as needed for pain.  6 tablet  0  . ibuprofen (ADVIL,MOTRIN) 200 MG tablet Take 600 mg by mouth every 6 (six) hours as needed for pain.      Marland Kitchen ondansetron (ZOFRAN) 4  MG tablet Take 1 tablet (4 mg total) by mouth every 6 (six) hours.  12 tablet  0  . polyethylene glycol powder (GLYCOLAX/MIRALAX) powder Take 17 g by mouth daily.  255 g  0   No current facility-administered medications on file prior to visit.    Review of Systems:  As per HPI- otherwise negative.   Physical Examination: Filed Vitals:   12/29/13 1006  BP: 100/64  Pulse: 58  Temp: 97.2 F (36.2 C)  Resp: 16   Filed Vitals:   12/29/13 1006  Height: 5\' 11"  (1.803 m)  Weight: 165 lb 3.2 oz (74.934 kg)   Body mass index is 23.05 kg/(m^2). Ideal Body Weight: Weight in (lb) to have BMI = 25: 178.9  GEN: WDWN, NAD, Non-toxic, A & O x 3. Looks well, fit build HEENT: Atraumatic, Normocephalic. Neck supple. No masses, No LAD. Ears and Nose: No external deformity. CV: RRR, No M/G/R. No JVD. No thrill. No extra heart sounds. PULM: CTA B, no wheezes, crackles, rhonchi. No retractions. No resp. distress. No accessory muscle use. ABD: S, NT, ND, +BS. No rebound. No HSM. EXTR: No c/c/e NEURO Normal gait.  PSYCH: Normally interactive. Conversant. Not depressed or anxious appearing.  Calm demeanor.  Normal testicles, penis, and DRE today  EKG: sinus bradycardia, OW wno Assessment and Plan: Physical exam  Screening for diabetes mellitus - Plan: Lipid panel  Hyperlipidemia - Plan: Hemoglobin A1c  Essential hypertension - Plan: EKG 12-Lead  Will cut back on his BP medications as he has lost weight and his BP is borderline low.  Will DC his benazapril.  If BP still under 120/80 he will then halve his HCTZ. Reassured him that his current BMI is healthy.  I would not encourage him to try and reach 150 lbs again as this will likely be very difficult and will not benefit him as far as his health  Will plan further follow- up pending labs. Plan recheck in about 6 months.    Signed Lamar Blinks, MD

## 2014-03-12 ENCOUNTER — Other Ambulatory Visit: Payer: Self-pay | Admitting: Internal Medicine

## 2014-04-13 ENCOUNTER — Other Ambulatory Visit: Payer: Self-pay | Admitting: Internal Medicine

## 2014-07-29 ENCOUNTER — Ambulatory Visit (INDEPENDENT_AMBULATORY_CARE_PROVIDER_SITE_OTHER): Payer: BLUE CROSS/BLUE SHIELD | Admitting: Family Medicine

## 2014-07-29 ENCOUNTER — Ambulatory Visit (INDEPENDENT_AMBULATORY_CARE_PROVIDER_SITE_OTHER): Payer: BLUE CROSS/BLUE SHIELD

## 2014-07-29 VITALS — BP 116/70 | HR 62 | Temp 97.5°F | Resp 16 | Ht 71.0 in | Wt 172.8 lb

## 2014-07-29 DIAGNOSIS — E785 Hyperlipidemia, unspecified: Secondary | ICD-10-CM | POA: Diagnosis not present

## 2014-07-29 DIAGNOSIS — I10 Essential (primary) hypertension: Secondary | ICD-10-CM | POA: Diagnosis not present

## 2014-07-29 DIAGNOSIS — R7309 Other abnormal glucose: Secondary | ICD-10-CM | POA: Diagnosis not present

## 2014-07-29 DIAGNOSIS — Z8639 Personal history of other endocrine, nutritional and metabolic disease: Secondary | ICD-10-CM

## 2014-07-29 DIAGNOSIS — M542 Cervicalgia: Secondary | ICD-10-CM | POA: Diagnosis not present

## 2014-07-29 DIAGNOSIS — Z8739 Personal history of other diseases of the musculoskeletal system and connective tissue: Secondary | ICD-10-CM

## 2014-07-29 LAB — HEMOGLOBIN A1C
Hgb A1c MFr Bld: 5.6 % (ref <5.7)
Mean Plasma Glucose: 114 mg/dL (ref <117)

## 2014-07-29 MED ORDER — ALLOPURINOL 300 MG PO TABS: 300.0000 mg | ORAL_TABLET | Freq: Every day | ORAL | Status: DC

## 2014-07-29 MED ORDER — BENAZEPRIL HCL 10 MG PO TABS: 5.0000 mg | ORAL_TABLET | Freq: Every day | ORAL | Status: DC

## 2014-07-29 MED ORDER — ATORVASTATIN CALCIUM 40 MG PO TABS: 40.0000 mg | ORAL_TABLET | Freq: Every day | ORAL | Status: DC

## 2014-07-29 MED ORDER — HYDROCHLOROTHIAZIDE 25 MG PO TABS
25.0000 mg | ORAL_TABLET | Freq: Every day | ORAL | Status: DC
Start: 2014-07-29 — End: 2015-07-19

## 2014-07-29 NOTE — Progress Notes (Signed)
[facility] [address] [place] [address] [phone]  Date:  07/29/2014   Name:  Joshua [name]   DOB:  [birth year]   MRN:  [id number]  PCP:  No PCP Per Patient    Chief Complaint: Neck Pain and Medication Refill   History of Present Illness:  Joshua [name] is a 59 y.o. very pleasant male patient who presents with the following:  He is here today seeking medicaiton refills. Also, he has a history of trouble with his neck.  10- 12 years ago this problem started.  He had an MRI, was told that he had deenerative disc disease.  His sx have been mild over the years.  Over the last 6 months he has had more trouble with his neck. He thinks he had injections in his c-spine at some point but he cannot really remember.   He has had numbness and cramping in his hands in the past but he does not have this now.  However he does have some trouble with neck flexion.  He is using ibuprofen as needed which is working ok  His wife was diagnosed with stage 4 ovarian cancer last fall.  She has had surgery and is undergoing chemo. This is of course upsetting to him but they hope that she will be able to live for a good period of time   Patient Active Problem List   Diagnosis Date Noted  . Other and unspecified hyperlipidemia 12/02/2013  . HTN (hypertension) 12/02/2013  . Gout 12/02/2013  . Meralgia paresthetica of right side 12/25/2012  . Seasonal allergic rhinitis 07/20/2012    Past Medical History  Diagnosis Date  . Hypertension   . Hyperlipidemia   . Allergy   . Asthma     Past Surgical History  Procedure Laterality Date  . Kidney stones    . Lithotripsy      History  Substance Use Topics  . Smoking status: Never Smoker   . Smokeless tobacco: Not on file  . Alcohol Use: Yes     Comment: Occasional    Family History  Problem Relation Age of Onset  . Hyperlipidemia Mother   . Diabetes Mother   . Alcohol abuse Father   . Alcohol abuse Brother   .  Drug abuse Brother   . Alcohol abuse Maternal Grandmother   . Hypertension Maternal Grandfather   . Hypertension Paternal Grandfather   . Cancer Sister   . Cancer Paternal Aunt     Allergies  Allergen Reactions  . Aspirin Anaphylaxis  . Shrimp [Shellfish Allergy] Itching and Other (See Comments)    Almond cherry Shrimp causes stomach upset, tingling of tongue and throat sometimes  . Benadryl [Diphenhydramine Hcl] Rash    Allergic to the topical only  . Cherry Itching  . Other Itching    almonds    Medication list has been reviewed and updated.  Current Outpatient Prescriptions on File Prior to Visit  Medication Sig Dispense Refill  . albuterol (PROVENTIL HFA;VENTOLIN HFA) 108 (90 BASE) MCG/ACT inhaler Inhale 2 puffs into the lungs every 6 (six) hours as needed. 8.5 g 0  . allopurinol (ZYLOPRIM) 300 MG tablet Take 1 tablet (300 mg total) by mouth daily. 90 tablet 3  . atorvastatin (LIPITOR) 40 MG tablet TAKE 1 TABLET BY MOUTH EVERY DAY 90 tablet 0  . benazepril (LOTENSIN) 10 MG tablet Take 0.5 tablets (5 mg total) by mouth daily. 45 tablet 3  . EPINEPHrine (EPIPEN) 0.3 mg/0.3  mL IJ SOAJ injection Inject 0.3 mLs (0.3 mg total) into the muscle once. 2 Device 2  . hydrochlorothiazide (HYDRODIURIL) 25 MG tablet Take 1 tablet (25 mg total) by mouth daily. 90 tablet 3  . ibuprofen (ADVIL,MOTRIN) 200 MG tablet Take 600 mg by mouth every 6 (six) hours as needed for pain.    . polyethylene glycol powder (GLYCOLAX/MIRALAX) powder Take 17 g by mouth daily. (Patient not taking: Reported on 07/29/2014) 255 g 0   No current facility-administered medications on file prior to visit.    Review of Systems:  As per HPI- otherwise negative.   Physical Examination: Filed Vitals:   07/29/14 1003  BP: 116/70  Pulse: 62  Temp: 97.5 F (36.4 C)  Resp: 16   Filed Vitals:   07/29/14 1003  Height: 5\' 11"  (1.803 m)  Weight: 172 lb 12.8 oz (78.382 kg)   Body mass index is 24.11  kg/(m^2). Ideal Body Weight: Weight in (lb) to have BMI = 25: 178.9  GEN: WDWN, NAD, Non-toxic, A & O x 3, normal weight, looks well HEENT: Atraumatic, Normocephalic. Neck supple. No masses, No LAD.  Bilateral TM wnl, oropharynx normal.  PEERL,EOMI.   Ears and Nose: No external deformity. CV: RRR, No M/G/R. No JVD. No thrill. No extra heart sounds. PULM: CTA B, no wheezes, crackles, rhonchi. No retractions. No resp. distress. No accessory muscle use. ABD: S, NT, ND, +BS. No rebound. No HSM. EXTR: No c/c/e NEURO Normal gait.  PSYCH: Normally interactive. Conversant. Not depressed or anxious appearing.  Calm demeanor.  Neck: slightly decrease in ROM with flexion, normal rotation although left is better than right, normal extension Normal strength, sensation and DTR of both UE  [facility] reading (PRIMARY) by  Dr. Lorelei Pont. Cervical spine: significant degenerative change esp at C4/5/6   CERVICAL SPINE 4+ VIEWS  COMPARISON: None.  FINDINGS: Multilevel degenerative change present particularly prominent C4-C5, and C5-C6. Mild loss of normal cervical lordosis noted, most likely secondary to degenerative change. Torticollis or ligamentous injury cannot be excluded. No evidence of fracture dislocation. Ligamentous calcification present. Pulmonary apices are clear.  IMPRESSION: Degenerative changes cervical spine particularly prominent at C5-C6. Loss of normal cervical lordosis, this is most likely degenerative.  Assessment and Plan: Neck pain - Plan: DG Cervical Spine Complete, Ambulatory referral to Orthopedic Surgery  History of gout - Plan: allopurinol (ZYLOPRIM) 300 MG tablet  Essential hypertension - Plan: benazepril (LOTENSIN) 10 MG tablet, hydrochlorothiazide (HYDRODIURIL) 25 MG tablet  Dyslipidemia - Plan: atorvastatin (LIPITOR) 40 MG tablet  Elevated glucose - Plan: Hemoglobin A1c  History of borderline A1c- await repeat result Refilled his medications.  Referral to spine to  look further at his neck   Signed Lamar Blinks, MD

## 2014-07-29 NOTE — Patient Instructions (Signed)
We will set you up with a spine specialist to look at your neck- take your x-ray CD with you when you go We did your refills today Please see me in about 6 months for labs I am so sorry to hear about your wife's illness- I hope that things go as well as possible!

## 2014-09-06 ENCOUNTER — Encounter: Payer: Self-pay | Admitting: Family Medicine

## 2014-09-06 DIAGNOSIS — M542 Cervicalgia: Secondary | ICD-10-CM

## 2014-09-06 DIAGNOSIS — G8929 Other chronic pain: Secondary | ICD-10-CM | POA: Insufficient documentation

## 2015-01-06 ENCOUNTER — Ambulatory Visit (INDEPENDENT_AMBULATORY_CARE_PROVIDER_SITE_OTHER): Payer: BLUE CROSS/BLUE SHIELD | Admitting: Family Medicine

## 2015-01-06 ENCOUNTER — Encounter: Payer: Self-pay | Admitting: Family Medicine

## 2015-01-06 VITALS — BP 121/74 | HR 69 | Temp 98.8°F | Resp 16 | Ht 70.75 in | Wt 173.6 lb

## 2015-01-06 DIAGNOSIS — Z23 Encounter for immunization: Secondary | ICD-10-CM

## 2015-01-06 DIAGNOSIS — I10 Essential (primary) hypertension: Secondary | ICD-10-CM

## 2015-01-06 DIAGNOSIS — E785 Hyperlipidemia, unspecified: Secondary | ICD-10-CM | POA: Diagnosis not present

## 2015-01-06 DIAGNOSIS — R1030 Lower abdominal pain, unspecified: Secondary | ICD-10-CM

## 2015-01-06 DIAGNOSIS — R7309 Other abnormal glucose: Secondary | ICD-10-CM

## 2015-01-06 DIAGNOSIS — Z125 Encounter for screening for malignant neoplasm of prostate: Secondary | ICD-10-CM | POA: Diagnosis not present

## 2015-01-06 LAB — LIPID PANEL
CHOL/HDL RATIO: 2.6 ratio (ref <=5.0)
CHOLESTEROL: 131 mg/dL (ref 125–200)
HDL: 50 mg/dL (ref >=40)
LDL CALC: 53 mg/dL (ref <130)
TRIGLYCERIDES: 140 mg/dL (ref <150)
VLDL: 28 mg/dL (ref <30)

## 2015-01-06 LAB — COMPREHENSIVE METABOLIC PANEL
ALBUMIN: 4.5 g/dL (ref 3.6–5.1)
ALT: 42 U/L (ref 9–46)
AST: 34 U/L (ref 10–35)
Alkaline Phosphatase: 101 U/L (ref 40–115)
BUN: 13 mg/dL (ref 7–25)
CO2: 30 mmol/L (ref 20–31)
Calcium: 9.8 mg/dL (ref 8.6–10.3)
Chloride: 104 mmol/L (ref 98–110)
Creat: 0.83 mg/dL (ref 0.70–1.33)
Glucose, Bld: 115 mg/dL — ABNORMAL HIGH (ref 65–99)
POTASSIUM: 4.4 mmol/L (ref 3.5–5.3)
Sodium: 142 mmol/L (ref 135–146)
TOTAL PROTEIN: 6.7 g/dL (ref 6.1–8.1)
Total Bilirubin: 0.6 mg/dL (ref 0.2–1.2)

## 2015-01-06 LAB — CBC
HCT: 43.6 % (ref 39.0–52.0)
Hemoglobin: 15.5 g/dL (ref 13.0–17.0)
MCH: 30.2 pg (ref 26.0–34.0)
MCHC: 35.6 g/dL (ref 30.0–36.0)
MCV: 84.8 fL (ref 78.0–100.0)
MPV: 10.8 fL (ref 8.6–12.4)
PLATELETS: 213 10*3/uL (ref 150–400)
RBC: 5.14 MIL/uL (ref 4.22–5.81)
RDW: 12.9 % (ref 11.5–15.5)
WBC: 6.4 10*3/uL (ref 4.0–10.5)

## 2015-01-06 NOTE — Progress Notes (Signed)
   Subjective:    Patient ID: Joshua [name], male    DOB: November 07, 1955, 59 y.o.   MRN: [id number]  HPI This is a pleasant 59 yo male who presents today with a lower abdominal pain for 6 weeks, the pain is noticeable when he runs or stretches. Pain is a "tightness," no aching or sharpness. Doesn't last more a couple of minutes, resolves as he runs. Pain has been very consistent in its nature over last 6 weeks. Has taken occasional acetaminophen or ibuprofen Had an episode of feeling incomptlety empty following voiding x 1 last week.   Has felt more fatigued with his runs lately. Sleeps well and feels rested when he awakens.   Review of Systems No nausea, no fever/chills, no chest pain, no SOB, no diarrhea or constipation, no urinary frequency, no dysuria, no hematuria, nocturia x 1, no testicular pain.     Objective:   Physical Exam Physical Exam  Constitutional: Oriented to person, place, and time. He appears well-developed and well-nourished.  HENT:  Head: Normocephalic and atraumatic.  Eyes: Conjunctivae are normal.  Neck: Normal range of motion. Neck supple.  Cardiovascular: Normal rate, regular rhythm and normal heart sounds.   Pulmonary/Chest: Effort normal and breath sounds normal.  Abdominal: no distension, no rebound, no guarding. Localized area of tenderness to deep palpation slightly distal to umbilicus and approximately 2 inches to the right. Possible slight separation of rectus abdominal muscles, no bulging. Examined with patient partially raised and contracting his abdominal wall muscles.   Musculoskeletal: Normal range of motion.  Neurological: Alert and oriented to person, place, and time.  Skin: Skin is warm and dry.  Psychiatric: Normal mood and affect. Behavior is normal. Judgment and thought content normal.  Vitals reviewed.  BP 121/74 mmHg  Pulse 69  Temp(Src) 98.8 F (37.1 C) (Oral)  Resp 16  Ht 5' 10.75" (1.797 m)  Wt 173 lb 9.6 oz (78.744 kg)  BMI 24.38  kg/m2 Wt Readings from Last 3 Encounters:  01/06/15 173 lb 9.6 oz (78.744 kg)  07/29/14 172 lb 12.8 oz (78.382 kg)  12/29/13 165 lb 3.2 oz (74.934 kg)      Assessment & Plan:  Discussed with Dr. Lorelei Pont 1. Need for prophylactic vaccination and inoculation against influenza - Flu Vaccine QUAD 36+ mos IM  2. Lower abdominal pain - possible early hernia vs strain/sprain - discussed options with patient- NSAIDs and wait and see, CT, or referral to general surgery - patient wishes conservative treatment at this point. Will try ibuprofen 400 mg po BID (he has aspirin allergy, but takes ibuprofen already without difficulty) for 5-7 days. - Follow up if any increased pain, new bulging/bruising or additional symptoms.   3. Essential hypertension - CBC - Comprehensive metabolic panel  4. Dyslipidemia - Comprehensive metabolic panel - Lipid panel - Hemoglobin A1c  5. Elevated glucose - Comprehensive metabolic panel - Lipid panel - Hemoglobin A1c  6. Screening for prostate cancer - PSA  - follow up in 6 months  Clarene Reamer, FNP-BC  [facility]  01/06/2015 5:35 PM

## 2015-01-06 NOTE — Patient Instructions (Signed)
Please take ibuprofen 400 mg twice a day for 5-7 days

## 2015-01-07 LAB — HEMOGLOBIN A1C
Hgb A1c MFr Bld: 5.9 % — ABNORMAL HIGH (ref <5.7)
Mean Plasma Glucose: 123 mg/dL — ABNORMAL HIGH (ref <117)

## 2015-01-07 LAB — PSA: PSA: 1.46 ng/mL (ref <=4.00)

## 2015-07-19 ENCOUNTER — Ambulatory Visit (INDEPENDENT_AMBULATORY_CARE_PROVIDER_SITE_OTHER): Payer: BLUE CROSS/BLUE SHIELD | Admitting: Family Medicine

## 2015-07-19 ENCOUNTER — Other Ambulatory Visit: Payer: Self-pay

## 2015-07-19 ENCOUNTER — Encounter: Payer: Self-pay | Admitting: Family Medicine

## 2015-07-19 ENCOUNTER — Telehealth: Payer: Self-pay | Admitting: Family Medicine

## 2015-07-19 VITALS — BP 139/94 | HR 72 | Temp 98.4°F | Ht 70.75 in | Wt 170.4 lb

## 2015-07-19 DIAGNOSIS — R109 Unspecified abdominal pain: Secondary | ICD-10-CM | POA: Diagnosis not present

## 2015-07-19 DIAGNOSIS — F4321 Adjustment disorder with depressed mood: Secondary | ICD-10-CM | POA: Diagnosis not present

## 2015-07-19 DIAGNOSIS — Z8739 Personal history of other diseases of the musculoskeletal system and connective tissue: Secondary | ICD-10-CM

## 2015-07-19 DIAGNOSIS — I10 Essential (primary) hypertension: Secondary | ICD-10-CM

## 2015-07-19 MED ORDER — HYDROCHLOROTHIAZIDE 25 MG PO TABS: 25.0000 mg | ORAL_TABLET | Freq: Every day | ORAL | Status: DC

## 2015-07-19 MED ORDER — ATORVASTATIN CALCIUM 10 MG PO TABS: 2.5000 mg | ORAL_TABLET | Freq: Every day | ORAL | Status: DC

## 2015-07-19 MED ORDER — ALLOPURINOL 300 MG PO TABS: 300.0000 mg | ORAL_TABLET | Freq: Every day | ORAL | Status: DC

## 2015-07-19 MED ORDER — BENAZEPRIL HCL 10 MG PO TABS: 5.0000 mg | ORAL_TABLET | Freq: Every day | ORAL | Status: DC

## 2015-07-19 NOTE — Progress Notes (Signed)
[place] at [facility] [address], [place], [place] [address] [phone]  Date:  07/19/2015   Name:  Joshua [name]   DOB:  1955/11/01   MRN:  [id number]  PCP:  Joshua Blinks, MD    Chief Complaint: Abdominal Pain   History of Present Illness:  Joshua [name] is a 60 y.o. very pleasant male patient who presents with the following:  Generally healthy man here today with a concern of abd pain. He brings with him recent labs done at work health fair- total chl 139, HDL 61, LDL 71.   He currently take 2.5 mg of lipitor once a day (1/4 of a 10mg  tablet a day).    His wife did sadly pass away in mid October of last year following a short battle with ovarian cancer.  He is doing as well as would be expected. Notes that his "waves of sadness" are still bad, but they hit him less frequently than just after she died.  He has channeled his grief into long distance running- this has always been a hobby but he has embraced it more since his wife passed. He feels that this really helps him manage his grief. He is especially interested in trail racing and recently did an event in [place].    He is here today due to a pain in his abdomen.  He has noted this for 6 months or a little more. Worse over the last 2 weeks .He will pain at or below the belt line when he goes into a jog. It hurts more when he just starts running and goes away within 200 yards or so.   He also has pain when he lies down and then sits up.   He does not do ab exercises but suspects they would hurt too  He does not get diarrhea.  No bowel changes Appetite is normal- good.  No early satiety.   No weight changes. He has not noted any groin sx, no bulges.   No urinary sx Never had an abd operation.   He did have normal CMP and CBC back in October 2016.   Wt Readings from Last 3 Encounters:  07/19/15 170 lb 6.4 oz (77.293 kg)  01/06/15 173 lb 9.6 oz (78.744 kg)  07/29/14  172 lb 12.8 oz (78.382 kg)     Patient Active Problem List   Diagnosis Date Noted  . Chronic neck pain 09/06/2014  . Other and unspecified hyperlipidemia 12/02/2013  . HTN (hypertension) 12/02/2013  . Gout 12/02/2013  . Meralgia paresthetica of right side 12/25/2012  . Seasonal allergic rhinitis 07/20/2012    Past Medical History  Diagnosis Date  . Hypertension   . Hyperlipidemia   . Allergy   . Asthma     Past Surgical History  Procedure Laterality Date  . Kidney stones    . Lithotripsy      Social History  Substance Use Topics  . Smoking status: Never Smoker   . Smokeless tobacco: None  . Alcohol Use: Yes     Comment: Occasional    Family History  Problem Relation Age of Onset  . Hyperlipidemia Mother   . Diabetes Mother   . Alcohol abuse Father   . Alcohol abuse Brother   . Drug abuse Brother   . Alcohol abuse Maternal Grandmother   . Hypertension Maternal Grandfather   . Hypertension Paternal Grandfather   . Cancer Sister   . Cancer Paternal  Aunt     Allergies  Allergen Reactions  . Aspirin Anaphylaxis  . Shrimp [Shellfish Allergy] Itching and Other (See Comments)    Almond cherry Shrimp causes stomach upset, tingling of tongue and throat sometimes  . Benadryl [Diphenhydramine Hcl] Rash    Allergic to the topical only  . Cherry Itching  . Other Itching    almonds    Medication list has been reviewed and updated.  Current Outpatient Prescriptions on File Prior to Visit  Medication Sig Dispense Refill  . albuterol (PROVENTIL HFA;VENTOLIN HFA) 108 (90 BASE) MCG/ACT inhaler Inhale 2 puffs into the lungs every 6 (six) hours as needed. 8.5 g 0  . allopurinol (ZYLOPRIM) 300 MG tablet Take 1 tablet (300 mg total) by mouth daily. 90 tablet 3  . atorvastatin (LIPITOR) 40 MG tablet Take 1 tablet (40 mg total) by mouth daily. 30 tablet 11  . benazepril (LOTENSIN) 10 MG tablet Take 0.5 tablets (5 mg total) by mouth daily. 45 tablet 3  . EPINEPHrine  (EPIPEN) 0.3 mg/0.3 mL IJ SOAJ injection Inject 0.3 mLs (0.3 mg total) into the muscle once. 2 Device 2  . hydrochlorothiazide (HYDRODIURIL) 25 MG tablet Take 1 tablet (25 mg total) by mouth daily. 90 tablet 3  . ibuprofen (ADVIL,MOTRIN) 200 MG tablet Take 600 mg by mouth every 6 (six) hours as needed for pain.    . polyethylene glycol powder (GLYCOLAX/MIRALAX) powder Take 17 g by mouth daily. (Patient not taking: Reported on 07/29/2014) 255 g 0   No current facility-administered medications on file prior to visit.    Review of Systems:  As per HPI- otherwise negative.   Physical Examination: Filed Vitals:   07/19/15 1401  BP: 139/94  Pulse: 72  Temp: 98.4 F (36.9 C)   Filed Vitals:   07/19/15 1401  Height: 5' 10.75" (1.797 m)  Weight: 170 lb 6.4 oz (77.293 kg)   Body mass index is 23.94 kg/(m^2). Ideal Body Weight: Weight in (lb) to have BMI = 25: 177.6  GEN: WDWN, NAD, Non-toxic, A & O x 3, look well and very fit for age 56: Atraumatic, Normocephalic. Neck supple. No masses, No LAD. Ears and Nose: No external deformity. CV: RRR, No M/G/R. No JVD. No thrill. No extra heart sounds. PULM: CTA B, no wheezes, crackles, rhonchi. No retractions. No resp. distress. No accessory muscle use. ABD: S, NT, ND, +BS. No rebound. No HSM. Possible small ventral defect at the lower abdominal muscles.  Otherwise I do not appreciate any mass or defect of the abd wall No inguinal hernia either EXTR: No c/c/e NEURO Normal gait.  PSYCH: Normally interactive. Conversant. Not depressed or anxious appearing.  Calm demeanor.    Assessment and Plan: Abdominal wall pain - Plan: Ambulatory referral to General Surgery  Grief  Here today to discuss a pain in his abdomen- seems to be in the abd wall- that has been present for over 6 months. I do not detect a definite abnormality on his exam but given duration of his sx will refer to general surgery for evaluation.  If this is negative will plan for  a CT scan He is managing his grief following the loss of his wife very well. He has channeled this into running which is giving him a lot of joy.  He will let me know if not doing ok in this regard   Signed Joshua Blinks, MD

## 2015-07-19 NOTE — Patient Instructions (Signed)
I will refer you to a general surgeon to look at your abdomen and see if they can detect a hernia.  If their evaluation is negative we can do a CT scan for you to look for any abnormality. We will set up an appt for you and please let me know if you have any questions or concerns.    Again, I am so sorry to hear of the loss of your wife.    I think you would enjoy the documentary "[facility]" on netfilx.

## 2015-07-19 NOTE — Telephone Encounter (Signed)
Caller name: Aelred  Relation to pt: self  Call back number: [phone] Pharmacy:[facility] [place], [place]  Reason for call: Pt came in office and was seen by PCP, pt states forgot to requested for refills on atorvastatin (LIPITOR) 40 MG tablet TO BE SENT TO  pharmacy [facility] [place]. Pt also states needs refills on allopurinol (ZYLOPRIM) 300 MG tablet, benazepril (LOTENSIN) 10 MG tablet and hydrochlorothiazide (HYDRODIURIL) 25 MG tablet and but these prescription need to be sent to [facility]. Please advise.

## 2015-07-19 NOTE — Progress Notes (Signed)
Pre visit review using our clinic review tool, if applicable. No additional management support is needed unless otherwise documented below in the visit note. 

## 2015-07-20 ENCOUNTER — Other Ambulatory Visit: Payer: Self-pay | Admitting: Family Medicine

## 2015-07-20 ENCOUNTER — Encounter: Payer: Self-pay | Admitting: Family Medicine

## 2015-07-20 DIAGNOSIS — I10 Essential (primary) hypertension: Secondary | ICD-10-CM

## 2015-07-20 MED ORDER — ATORVASTATIN CALCIUM 40 MG PO TABS: 40.0000 mg | ORAL_TABLET | Freq: Every day | ORAL | Status: DC

## 2015-07-20 MED ORDER — ALBUTEROL SULFATE HFA 108 (90 BASE) MCG/ACT IN AERS: 2.0000 | INHALATION_SPRAY | Freq: Four times a day (QID) | RESPIRATORY_TRACT | Status: DC | PRN

## 2015-07-20 MED ORDER — BENAZEPRIL HCL 10 MG PO TABS: ORAL_TABLET | ORAL | Status: DC

## 2015-08-23 ENCOUNTER — Other Ambulatory Visit: Payer: Self-pay | Admitting: Emergency Medicine

## 2015-09-10 ENCOUNTER — Other Ambulatory Visit: Payer: Self-pay | Admitting: Family Medicine

## 2016-06-27 ENCOUNTER — Telehealth: Payer: Self-pay | Admitting: Emergency Medicine

## 2016-06-27 MED ORDER — ATORVASTATIN CALCIUM 40 MG PO TABS: 40.0000 mg | ORAL_TABLET | Freq: Every day | ORAL | 0 refills | Status: DC

## 2016-07-05 ENCOUNTER — Other Ambulatory Visit: Payer: Self-pay | Admitting: Emergency Medicine

## 2016-07-05 MED ORDER — ATORVASTATIN CALCIUM 40 MG PO TABS: 40.0000 mg | ORAL_TABLET | Freq: Every day | ORAL | 0 refills | Status: DC

## 2016-07-05 NOTE — Telephone Encounter (Signed)
Pt called in to follow up on refill request. Pt says that his insurance will only cover a  90 day supply. Pt says that he is down to 4 pills.     CB: [id number]    [facility]: [id number] [place], [place]

## 2016-07-05 NOTE — Telephone Encounter (Signed)
Refill sent to pharmacy per pt request. Called pt to schedule follow up visit with provider since it's been a while since last visit.

## 2016-07-10 ENCOUNTER — Ambulatory Visit (INDEPENDENT_AMBULATORY_CARE_PROVIDER_SITE_OTHER): Payer: 59 | Admitting: Family Medicine

## 2016-07-10 ENCOUNTER — Encounter: Payer: Self-pay | Admitting: Family Medicine

## 2016-07-10 VITALS — BP 110/80 | HR 58 | Temp 97.8°F | Ht 70.75 in | Wt 170.6 lb

## 2016-07-10 DIAGNOSIS — Z0000 Encounter for general adult medical examination without abnormal findings: Secondary | ICD-10-CM

## 2016-07-10 DIAGNOSIS — Z125 Encounter for screening for malignant neoplasm of prostate: Secondary | ICD-10-CM

## 2016-07-10 DIAGNOSIS — I10 Essential (primary) hypertension: Secondary | ICD-10-CM

## 2016-07-10 DIAGNOSIS — Z1159 Encounter for screening for other viral diseases: Secondary | ICD-10-CM

## 2016-07-10 DIAGNOSIS — Z13220 Encounter for screening for lipoid disorders: Secondary | ICD-10-CM

## 2016-07-10 DIAGNOSIS — Z131 Encounter for screening for diabetes mellitus: Secondary | ICD-10-CM

## 2016-07-10 DIAGNOSIS — Z91030 Bee allergy status: Secondary | ICD-10-CM

## 2016-07-10 DIAGNOSIS — Z87892 Personal history of anaphylaxis: Secondary | ICD-10-CM

## 2016-07-10 DIAGNOSIS — Z130 Encounter for screening for diseases of the blood and blood-forming organs and certain disorders involving the immune mechanism: Secondary | ICD-10-CM

## 2016-07-10 LAB — LIPID PANEL
CHOLESTEROL: 130 mg/dL (ref 0–200)
HDL: 52.9 mg/dL (ref >39.00)
LDL Cholesterol: 60 mg/dL (ref 0–99)
NonHDL: 76.7
TRIGLYCERIDES: 83 mg/dL (ref 0.0–149.0)
Total CHOL/HDL Ratio: 2
VLDL: 16.6 mg/dL (ref 0.0–40.0)

## 2016-07-10 LAB — HEMOGLOBIN A1C: Hgb A1c MFr Bld: 5.8 % (ref 4.6–6.5)

## 2016-07-10 LAB — COMPREHENSIVE METABOLIC PANEL
ALT: 23 U/L (ref 0–53)
AST: 23 U/L (ref 0–37)
Albumin: 4.7 g/dL (ref 3.5–5.2)
Alkaline Phosphatase: 74 U/L (ref 39–117)
BILIRUBIN TOTAL: 0.9 mg/dL (ref 0.2–1.2)
BUN: 18 mg/dL (ref 6–23)
CALCIUM: 9.9 mg/dL (ref 8.4–10.5)
CO2: 31 meq/L (ref 19–32)
CREATININE: 0.95 mg/dL (ref 0.40–1.50)
Chloride: 104 mEq/L (ref 96–112)
GFR: 86.13 mL/min (ref >60.00)
GLUCOSE: 100 mg/dL — AB (ref 70–99)
Potassium: 4.5 mEq/L (ref 3.5–5.1)
Sodium: 140 mEq/L (ref 135–145)
Total Protein: 6.7 g/dL (ref 6.0–8.3)

## 2016-07-10 LAB — CBC
HCT: 45.5 % (ref 39.0–52.0)
Hemoglobin: 15.6 g/dL (ref 13.0–17.0)
MCHC: 34.4 g/dL (ref 30.0–36.0)
MCV: 86.9 fl (ref 78.0–100.0)
Platelets: 199 10*3/uL (ref 150.0–400.0)
RBC: 5.24 Mil/uL (ref 4.22–5.81)
RDW: 13.2 % (ref 11.5–15.5)
WBC: 7 10*3/uL (ref 4.0–10.5)

## 2016-07-10 LAB — PSA: PSA: 1.12 ng/mL (ref 0.10–4.00)

## 2016-07-10 LAB — HEPATITIS C ANTIBODY: HCV AB: NEGATIVE

## 2016-07-10 MED ORDER — HYDROCHLOROTHIAZIDE 25 MG PO TABS: 25.0000 mg | ORAL_TABLET | Freq: Every day | ORAL | 3 refills | Status: DC

## 2016-07-10 MED ORDER — EPINEPHRINE 0.3 MG/0.3ML IJ SOAJ: 0.3000 mg | Freq: Once | INTRAMUSCULAR | 2 refills | Status: AC

## 2016-07-10 MED ORDER — BENAZEPRIL HCL 10 MG PO TABS: ORAL_TABLET | ORAL | 3 refills | Status: DC

## 2016-07-10 NOTE — Progress Notes (Signed)
[place] at [facility] [address] [place], [place] [address] [phone]  Date:  07/10/2016   Name:  Joshua [name]   DOB:  [birth year]   MRN:  [id number]  PCP:  Lamar Blinks, MD    Chief Complaint: Follow-up   History of Present Illness:  Joshua [name] is a 61 y.o. very pleasant male patient who presents with the following:  Seen by myself in May of last year- see partial HPI as below.  Physical exam today Generally healthy man here today with a concern of abd pain. He brings with him recent labs done at work health fair- total chl 139, HDL 61, LDL 71.   He currently take 2.5 mg of lipitor once a day (1/4 of a 83m tablet a day).    His wife did sadly pass away in mid October of last year following a short battle with ovarian cancer.  He is doing as well as would be expected. Notes that his "waves of sadness" are still bad, but they hit him less frequently than just after she died.  He has channeled his grief into long distance running- this has always been a hobby but he has embraced it more since his wife passed. He feels that this really helps him manage his grief. He is especially interested in trail racing and recently did an event in [place]    Last labs a couple of years ago.  He is seeing someone- they started dating just about one year after his wife Jan passed.  She, her son and her son's GF all placed in their age groups in a recent race.  Don met his GF through his running group. I am very glad that he has found companionship again  He is suffering from some allergies, he is taking loratadine most days but not always  He has not had any recent gout sx- he stopped taking his allopurinol in January due to an insurance change but seems to be doing fine without it so far.  He will let me know if any problems   He did have an epipen due to history of anaphylaxis to aspirin.  He did have shots to help desensitize  himself from bee sting allergy a few years ago.  Admits that his epipen is expired and he does not have one at home  He ate just an orange so far today  He has noted that his bladder control is not quite as good recnetly- he will have to go urgently and will sometimes dribble a bit.  No pain with urination. This is not bothersome enough for him to want to use any medication at this time however  He is getting plenty of exercise- no CP or SOB with running He is a non smoker  Patient Active Problem List   Diagnosis Date Noted  . Chronic neck pain 09/06/2014  . Other and unspecified hyperlipidemia 12/02/2013  . HTN (hypertension) 12/02/2013  . Gout 12/02/2013  . Meralgia paresthetica of right side 12/25/2012  . Seasonal allergic rhinitis 07/20/2012    Past Medical History:  Diagnosis Date  . Allergy   . Asthma   . Hyperlipidemia   . Hypertension     Past Surgical History:  Procedure Laterality Date  . kidney stones    . LITHOTRIPSY      Social History  Substance Use Topics  . Smoking status: Never Smoker  . Smokeless tobacco: Not on file  .  Alcohol use Yes     Comment: Occasional    Family History  Problem Relation Age of Onset  . Hyperlipidemia Mother   . Diabetes Mother   . Alcohol abuse Father   . Alcohol abuse Brother   . Drug abuse Brother   . Alcohol abuse Maternal Grandmother   . Hypertension Maternal Grandfather   . Hypertension Paternal Grandfather   . Cancer Sister   . Cancer Paternal Aunt     Allergies  Allergen Reactions  . Aspirin Anaphylaxis  . Shrimp [Shellfish Allergy] Itching and Other (See Comments)    Almond cherry Shrimp causes stomach upset, tingling of tongue and throat sometimes  . Benadryl [Diphenhydramine Hcl] Rash    Allergic to the topical only  . Cherry Itching  . Other Itching    almonds    Medication list has been reviewed and updated.  Current Outpatient Prescriptions on File Prior to Visit  Medication Sig Dispense  Refill  . atorvastatin (LIPITOR) 40 MG tablet Take 1 tablet (40 mg total) by mouth daily. 90 tablet 0  . ibuprofen (ADVIL,MOTRIN) 200 MG tablet Take 600 mg by mouth every 6 (six) hours as needed for pain.     No current facility-administered medications on file prior to visit.     Review of Systems:  As per HPI- otherwise negative.   Physical Examination: Vitals:   07/10/16 1017  BP: 110/80  Pulse: (!) 58  Temp: 97.8 F (36.6 C)   Vitals:   07/10/16 1017  Weight: 170 lb 9.6 oz (77.4 kg)  Height: 5' 10.75" (1.797 m)   Body mass index is 23.96 kg/m. Ideal Body Weight: Weight in (lb) to have BMI = 25: 177.6  GEN: WDWN, NAD, Non-toxic, A & O x 3, slim build, looks well HEENT: Atraumatic, Normocephalic. Neck supple. No masses, No LAD.  Bilateral hearing aids oropharynx normal.  PEERL,EOMI.   Ears and Nose: No external deformity. CV: RRR, No M/G/R. No JVD. No thrill. No extra heart sounds. PULM: CTA B, no wheezes, crackles, rhonchi. No retractions. No resp. distress. No accessory muscle use. ABD: S, NT, ND. No rebound. No HSM. EXTR: No c/c/e NEURO Normal gait.  PSYCH: Normally interactive. Conversant. Not depressed or anxious appearing.  Calm demeanor.    Assessment and Plan: Physical exam  Screening for prostate cancer - Plan: PSA  Encounter for hepatitis C screening test for low risk patient - Plan: Hepatitis C antibody  Screening for hyperlipidemia - Plan: Lipid panel  Essential hypertension - Plan: benazepril (LOTENSIN) 10 MG tablet, hydrochlorothiazide (HYDRODIURIL) 25 MG tablet  Screening for diabetes mellitus - Plan: Comprehensive metabolic panel, Hemoglobin A1c  Screening for deficiency anemia - Plan: CBC  History of anaphylaxis - Plan: EPINEPHrine (EPIPEN) 0.3 mg/0.3 mL IJ SOAJ injection  History of bee sting allergy - Plan: EPINEPHrine (EPIPEN) 0.3 mg/0.3 mL IJ SOAJ injection  CPE today Refilled medications and encouraged him to keep an epipen on hand-  he will do so Will plan further follow- up pending labs. See patient instructions for more details.     Signed Lamar Blinks, MD

## 2016-07-10 NOTE — Patient Instructions (Signed)
It was excellent to see you today- take care!   I will be in touch with your labs asap Please do consider keeping an epipen (or other epiephrine device) with you especially if you will trail running; this can be a high risk situation for a bee sting and help may not be readily available Your BP looks fine today

## 2016-09-30 ENCOUNTER — Other Ambulatory Visit: Payer: Self-pay | Admitting: Family Medicine

## 2016-10-04 NOTE — Telephone Encounter (Signed)
Pt called in to follow up on refill request. Pt say that he have about 5/6 days worth. Advised that provider has been out of the office. He expresses understanding.   Please assist further.

## 2016-12-18 ENCOUNTER — Other Ambulatory Visit: Payer: Self-pay | Admitting: Family Medicine

## 2017-03-15 ENCOUNTER — Other Ambulatory Visit: Payer: Self-pay | Admitting: Family Medicine

## 2017-04-17 ENCOUNTER — Ambulatory Visit (INDEPENDENT_AMBULATORY_CARE_PROVIDER_SITE_OTHER): Payer: 59 | Admitting: Family Medicine

## 2017-04-17 ENCOUNTER — Encounter: Payer: Self-pay | Admitting: Family Medicine

## 2017-04-17 VITALS — BP 120/68 | HR 86 | Temp 99.1°F | Ht 70.0 in | Wt 172.0 lb

## 2017-04-17 DIAGNOSIS — J0110 Acute frontal sinusitis, unspecified: Secondary | ICD-10-CM

## 2017-04-17 MED ORDER — AZITHROMYCIN 250 MG PO TABS: ORAL_TABLET | ORAL | 0 refills | Status: DC

## 2017-04-17 NOTE — Patient Instructions (Signed)
Please try things such as zyrtec-D or allegra-D which is an antihistamine and decongestant.   Please try afrin which will help with nasal congestion but use for only three days.   Please also try using a netti pot on a regular occasion.  Honey can help with a sore throat.   vicks can help with breathing and cough

## 2017-04-17 NOTE — Progress Notes (Signed)
Joshua [name] - 62 y.o. male MRN [id number]  Date of birth: 1955/04/25  SUBJECTIVE:  Including CC & ROS.  Chief Complaint  Patient presents with  . Sinus pressure    Joshua [name] is a 62 y.o. male that is sinus pressure and cough. Symptoms have been ongoing for three days. Admits to fever and body aches.  Has been coughing up green mucous. He has been taking motrin and sudafed with improvement. Has a history of sinus infection from years ago.    Review of Systems  Constitutional: Negative for fever.  HENT: Positive for sinus pressure and sinus pain.   Respiratory: Positive for cough.   Cardiovascular: Negative for chest pain.  Gastrointestinal: Negative for abdominal pain.  Musculoskeletal: Negative for back pain.  Neurological: Negative for weakness.  Hematological: Negative for adenopathy.    HISTORY: Past Medical, Surgical, Social, and Family History Reviewed & Updated per EMR.   Pertinent Historical Findings include:  Past Medical History:  Diagnosis Date  . Allergy   . Asthma   . Hyperlipidemia   . Hypertension     Past Surgical History:  Procedure Laterality Date  . kidney stones    . LITHOTRIPSY      Allergies  Allergen Reactions  . Aspirin Anaphylaxis  . Shrimp [Shellfish Allergy] Itching and Other (See Comments)    Almond cherry Shrimp causes stomach upset, tingling of tongue and throat sometimes  . Benadryl [Diphenhydramine Hcl] Rash    Allergic to the topical only  . Cherry Itching  . Other Itching    almonds    Family History  Problem Relation Age of Onset  . Hyperlipidemia Mother   . Diabetes Mother   . Alcohol abuse Father   . Alcohol abuse Brother   . Drug abuse Brother   . Alcohol abuse Maternal Grandmother   . Hypertension Maternal Grandfather   . Hypertension Paternal Grandfather   . Cancer Sister   . Cancer Paternal Aunt      Social History   Socioeconomic History  . Marital status: Married    Spouse name: Not on file  .  Number of children: Not on file  . Years of education: Not on file  . Highest education level: Not on file  Social Needs  . Financial resource strain: Not on file  . Food insecurity - worry: Not on file  . Food insecurity - inability: Not on file  . Transportation needs - medical: Not on file  . Transportation needs - non-medical: Not on file  Occupational History  . Not on file  Tobacco Use  . Smoking status: Never Smoker  . Smokeless tobacco: Never Used  Substance and Sexual Activity  . Alcohol use: Yes    Comment: Occasional  . Drug use: No  . Sexual activity: Yes    Birth control/protection: None  Other Topics Concern  . Not on file  Social History Narrative  . Not on file     PHYSICAL EXAM:  VS: BP 120/68 (BP Location: Left Arm, Patient Position: Sitting, Cuff Size: Normal)   Pulse 86   Temp 99.1 F (37.3 C) (Oral)   Ht 5\' 10"  (1.778 m)   Wt 172 lb (78 kg)   SpO2 98%   BMI 24.68 kg/m  Physical Exam Gen: NAD, alert, cooperative with exam, well-appearing ENT: normal lips, normal nasal mucosa, tympanic membranes clear and intact bilaterally, normal oropharynx, no cervical lymphadenopathy Eye: normal EOM, normal conjunctiva and lids CV:  no edema, +2 pedal pulses,  regular rate and rhythm, S1-S2   Resp: no accessory muscle use, non-labored, clear to auscultation bilaterally, no crackles or wheezes Skin: no rashes, no areas of induration  Neuro: normal tone, normal sensation to touch Psych:  normal insight, alert and oriented MSK: Normal gait, normal strength       ASSESSMENT & PLAN:   Acute non-recurrent frontal sinusitis Likely viral in nature.  - counseled on supportive care  - azithro provided if symptoms do not improve.

## 2017-04-17 NOTE — Assessment & Plan Note (Signed)
Likely viral in nature.  - counseled on supportive care  - azithro provided if symptoms do not improve.

## 2017-05-02 NOTE — Progress Notes (Signed)
[facility] [address], [place], [place] [address] [phone]  Date:  05/03/2017   Name:  Joshua [name]   DOB:  04-14-1955   MRN:  [id number]  PCP:  Darreld Mclean, MD    Chief Complaint: Annual Exam (Pt here for CPE. )   History of Present Illness:  Joshua [name] is a 62 y.o. very pleasant male patient who presents with the following:  Here today for a CPE History of HTN, hyperlipidemia and gout, pre-diabetes  Flu: done already Tetanus: UTD Colon: due next year Labs/ psa- can do today- he is fasting today  He noted a stratchy throat over the last couple of days, but is otherwise feeling well No fever He otherwise feels well  He has to avoid aspirin due to history of anaphylaxis. He did change laundry detergent recently and developed some itching of his skin. He plans to change back to his old detergent He is still running quite a bit- he has a 1/2 marathon coming up  He had an epidural steroid injection several years ago for his back. This is bothering him more recently again See notes from 2014, fall  When he starts his run, he will feel a sharp pain in his lower abdomen.  This goes away after a 1/2 mile, consistently. Otherwise no pain or other symtoms in his belly He has not noted a defect or bulge to suggest a hernia   He stopped taking allopinol a few years ago as his gout flares were so very rare. Did well except for one flare a few months ago   He fell in 2017 and hurt his right shoulder.  He has some pain with his ROM, it can feel weak.   No nighttime pain or pain when supine. His shoulder mildly holds him back from doing his activities   His wife passed away a few years ago, he is now in a relationship with a woman who he met through his running. They are enjoying being together  Patient Active Problem List   Diagnosis Date Noted  . Acute non-recurrent frontal sinusitis 04/17/2017  .  Chronic neck pain 09/06/2014  . Other and unspecified hyperlipidemia 12/02/2013  . HTN (hypertension) 12/02/2013  . Gout 12/02/2013  . Meralgia paresthetica of right side 12/25/2012  . Seasonal allergic rhinitis 07/20/2012    Past Medical History:  Diagnosis Date  . Allergy   . Asthma   . Hyperlipidemia   . Hypertension     Past Surgical History:  Procedure Laterality Date  . kidney stones    . LITHOTRIPSY      Social History   Tobacco Use  . Smoking status: Never Smoker  . Smokeless tobacco: Never Used  Substance Use Topics  . Alcohol use: Yes    Comment: Occasional  . Drug use: No    Family History  Problem Relation Age of Onset  . Hyperlipidemia Mother   . Diabetes Mother   . Alcohol abuse Father   . Alcohol abuse Brother   . Drug abuse Brother   . Alcohol abuse Maternal Grandmother   . Hypertension Maternal Grandfather   . Hypertension Paternal Grandfather   . Cancer Sister   . Cancer Paternal Aunt     Allergies  Allergen Reactions  . Aspirin Anaphylaxis  . Shrimp [Shellfish Allergy] Itching and Other (See Comments)    Almond cherry Shrimp causes stomach upset, tingling of tongue and throat  sometimes  . Benadryl [Diphenhydramine Hcl] Rash    Allergic to the topical only  . Cherry Itching  . Other Itching    almonds    Medication list has been reviewed and updated.  Current Outpatient Medications on File Prior to Visit  Medication Sig Dispense Refill  . atorvastatin (LIPITOR) 40 MG tablet TAKE 1 TABLET BY MOUTH EVERY DAY 90 tablet 0  . benazepril (LOTENSIN) 10 MG tablet Take 1/4 tablet (2.22m) by mouth once daily 30 tablet 3  . hydrochlorothiazide (HYDRODIURIL) 25 MG tablet Take 1 tablet (25 mg total) by mouth daily. 90 tablet 3  . ibuprofen (ADVIL,MOTRIN) 200 MG tablet Take 600 mg by mouth every 6 (six) hours as needed for pain.     No current facility-administered medications on file prior to visit.     Review of Systems:  As per HPI-  otherwise negative.   Physical Examination: Vitals:   05/03/17 1002  BP: (!) 142/84  Pulse: 72  Temp: 97.7 F (36.5 C)  SpO2: 98%   Vitals:   05/03/17 1002  Weight: 169 lb 6.4 oz (76.8 kg)  Height: _0  (1.778 m)   Body mass index is 24.31 kg/m. Ideal Body Weight: Weight in (lb) to have BMI = 25: 173.9  GEN: WDWN, NAD, Non-toxic, A & O x 3, fit build, looks well  HEENT: Atraumatic, Normocephalic. Neck supple. No masses, No LAD.  Bilateral TM wnl, oropharynx normal.  PEERL,EOMI.   Ears and Nose: No external deformity. CV: RRR, No M/G/R. No JVD. No thrill. No extra heart sounds. PULM: CTA B, no wheezes, crackles, rhonchi. No retractions. No resp. distress. No accessory muscle use. ABD: S, NT, ND, +BS. No rebound. No HSM. Cannot palpate any defect or hernia in his lower abd, no tenderness on exam EXTR: No c/c/e NEURO Normal gait.  PSYCH: Normally interactive. Conversant. Not depressed or anxious appearing.  Calm demeanor.  Right shoulder:  His ROM is full, except for internal rotation.   Assessment and Plan: Physical exam  Screening for prostate cancer - Plan: PSA  Essential hypertension - Plan: CBC, hydrochlorothiazide (HYDRODIURIL) 25 MG tablet, benazepril (LOTENSIN) 5 MG tablet  Screening for deficiency anemia - Plan: CBC  Hyperlipidemia, unspecified hyperlipidemia type - Plan: Lipid panel, atorvastatin (LIPITOR) 40 MG tablet  Pre-diabetes - Plan: Comprehensive metabolic panel, Hemoglobin A1c  Acute idiopathic gout involving toe, unspecified laterality - Plan: colchicine 0.6 MG tablet  Lumbar disc disorder - Plan: Amb referral to Pediatric Physical Medicine Rehab  CPE today Labs pending as above- Will plan further follow- up pending labs. rx for colchicine to have on hand in case of further gout flare Refilled medications for HTN and cholesterol Offered to have him see PT for his shoulder- for now he prefer to do some home PT (given hand- out) but he will let  me know if not helpful He had an epidural steroid injection several years ago following an MRI 11/14- will refer to PM and R for evaluation and perhaps repeat injection at this time   IMPRESSION: 1. Foraminal disc herniation at L2-L3 which could affect the right L2 nerve. 2. Disc and endplate degeneration at L5-S1. Moderate right L5 neural foraminal stenosis. Small central disc protrusion which might affect the S1 nerve roots in the lateral recesses. 3. No lumbar spinal stenosis or acute osseous abnormality.   Signed JLamar Blinks MD  Received his labs   Results for orders placed or performed in visit on 05/03/17  CBC  Result  Value Ref Range   WBC 6.8 4.0 - 10.5 K/uL   RBC 5.08 4.22 - 5.81 Mil/uL   Platelets 230.0 150.0 - 400.0 K/uL   Hemoglobin 15.1 13.0 - 17.0 g/dL   HCT 43.6 39.0 - 52.0 %   MCV 85.9 78.0 - 100.0 fl   MCHC 34.7 30.0 - 36.0 g/dL   RDW 13.1 11.5 - 15.5 %  Comprehensive metabolic panel  Result Value Ref Range   Sodium 138 135 - 145 mEq/L   Potassium 4.7 3.5 - 5.1 mEq/L   Chloride 102 96 - 112 mEq/L   CO2 31 19 - 32 mEq/L   Glucose, Bld 93 70 - 99 mg/dL   BUN 13 6 - 23 mg/dL   Creatinine, Ser 0.93 0.40 - 1.50 mg/dL   Total Bilirubin 0.9 0.2 - 1.2 mg/dL   Alkaline Phosphatase 84 39 - 117 U/L   AST 25 0 - 37 U/L   ALT 24 0 - 53 U/L   Total Protein 6.7 6.0 - 8.3 g/dL   Albumin 4.4 3.5 - 5.2 g/dL   Calcium 9.8 8.4 - 10.5 mg/dL   GFR 88.03 >60.00 mL/min  Hemoglobin A1c  Result Value Ref Range   Hgb A1c MFr Bld 5.8 4.6 - 6.5 %  Lipid panel  Result Value Ref Range   Cholesterol 138 0 - 200 mg/dL   Triglycerides 94.0 0.0 - 149.0 mg/dL   HDL 55.10 >39.00 mg/dL   VLDL 18.8 0.0 - 40.0 mg/dL   LDL Cholesterol 64 0 - 99 mg/dL   Total CHOL/HDL Ratio 3    NonHDL 83.14   PSA  Result Value Ref Range   PSA 3.43 0.10 - 4.00 ng/mL   Message to pt  Blood count is normal Metabolic profile is normal A1c is stable, in the low pre-diabetes range Cholesterol  looks very good  Lab Results      Component                Value               Date                      PSA                      3.43                05/03/2017                PSA                      1.12                07/10/2016                PSA                      1.46                01/06/2015           Your PSA is still in range, but it has gone up significantly over the last year.  This does not necessarily mean anything, but we need to follow-up.   Please come in for a repeat PSA in bout 3 months.  No sex for 48 hours prior.  I will order the PSA for you as a lab visit only.  Hopefully  your number will trend back down- it if does not I will refer you to urology.    Let me know if any questions and take care!

## 2017-05-03 ENCOUNTER — Encounter: Payer: Self-pay | Admitting: Family Medicine

## 2017-05-03 ENCOUNTER — Ambulatory Visit (INDEPENDENT_AMBULATORY_CARE_PROVIDER_SITE_OTHER): Payer: 59 | Admitting: Family Medicine

## 2017-05-03 VITALS — BP 142/84 | HR 72 | Temp 97.7°F | Ht 70.0 in | Wt 169.4 lb

## 2017-05-03 DIAGNOSIS — R7303 Prediabetes: Secondary | ICD-10-CM | POA: Diagnosis not present

## 2017-05-03 DIAGNOSIS — Z125 Encounter for screening for malignant neoplasm of prostate: Secondary | ICD-10-CM

## 2017-05-03 DIAGNOSIS — I10 Essential (primary) hypertension: Secondary | ICD-10-CM | POA: Diagnosis not present

## 2017-05-03 DIAGNOSIS — E785 Hyperlipidemia, unspecified: Secondary | ICD-10-CM

## 2017-05-03 DIAGNOSIS — Z0000 Encounter for general adult medical examination without abnormal findings: Secondary | ICD-10-CM

## 2017-05-03 DIAGNOSIS — Z130 Encounter for screening for diseases of the blood and blood-forming organs and certain disorders involving the immune mechanism: Secondary | ICD-10-CM | POA: Diagnosis not present

## 2017-05-03 DIAGNOSIS — M519 Unspecified thoracic, thoracolumbar and lumbosacral intervertebral disc disorder: Secondary | ICD-10-CM | POA: Diagnosis not present

## 2017-05-03 DIAGNOSIS — R972 Elevated prostate specific antigen [PSA]: Secondary | ICD-10-CM | POA: Diagnosis not present

## 2017-05-03 DIAGNOSIS — M10079 Idiopathic gout, unspecified ankle and foot: Secondary | ICD-10-CM | POA: Diagnosis not present

## 2017-05-03 LAB — COMPREHENSIVE METABOLIC PANEL
ALK PHOS: 84 U/L (ref 39–117)
ALT: 24 U/L (ref 0–53)
AST: 25 U/L (ref 0–37)
Albumin: 4.4 g/dL (ref 3.5–5.2)
BILIRUBIN TOTAL: 0.9 mg/dL (ref 0.2–1.2)
BUN: 13 mg/dL (ref 6–23)
CO2: 31 meq/L (ref 19–32)
CREATININE: 0.93 mg/dL (ref 0.40–1.50)
Calcium: 9.8 mg/dL (ref 8.4–10.5)
Chloride: 102 mEq/L (ref 96–112)
GFR: 88.03 mL/min (ref >60.00)
GLUCOSE: 93 mg/dL (ref 70–99)
Potassium: 4.7 mEq/L (ref 3.5–5.1)
Sodium: 138 mEq/L (ref 135–145)
TOTAL PROTEIN: 6.7 g/dL (ref 6.0–8.3)

## 2017-05-03 LAB — CBC
HCT: 43.6 % (ref 39.0–52.0)
HEMOGLOBIN: 15.1 g/dL (ref 13.0–17.0)
MCHC: 34.7 g/dL (ref 30.0–36.0)
MCV: 85.9 fl (ref 78.0–100.0)
PLATELETS: 230 10*3/uL (ref 150.0–400.0)
RBC: 5.08 Mil/uL (ref 4.22–5.81)
RDW: 13.1 % (ref 11.5–15.5)
WBC: 6.8 10*3/uL (ref 4.0–10.5)

## 2017-05-03 LAB — LIPID PANEL
CHOL/HDL RATIO: 3
Cholesterol: 138 mg/dL (ref 0–200)
HDL: 55.1 mg/dL (ref >39.00)
LDL CALC: 64 mg/dL (ref 0–99)
NonHDL: 83.14
Triglycerides: 94 mg/dL (ref 0.0–149.0)
VLDL: 18.8 mg/dL (ref 0.0–40.0)

## 2017-05-03 LAB — PSA: PSA: 3.43 ng/mL (ref 0.10–4.00)

## 2017-05-03 LAB — HEMOGLOBIN A1C: Hgb A1c MFr Bld: 5.8 % (ref 4.6–6.5)

## 2017-05-03 MED ORDER — BENAZEPRIL HCL 5 MG PO TABS: ORAL_TABLET | ORAL | 3 refills | Status: DC

## 2017-05-03 MED ORDER — ATORVASTATIN CALCIUM 40 MG PO TABS: 40.0000 mg | ORAL_TABLET | Freq: Every day | ORAL | 3 refills | Status: DC

## 2017-05-03 MED ORDER — HYDROCHLOROTHIAZIDE 25 MG PO TABS: 25.0000 mg | ORAL_TABLET | Freq: Every day | ORAL | 3 refills | Status: DC

## 2017-05-03 MED ORDER — COLCHICINE 0.6 MG PO TABS: ORAL_TABLET | ORAL | 0 refills | Status: DC

## 2017-05-03 NOTE — Patient Instructions (Signed)
Good to see you again today- best of luck in your race coming up! I will be in touch with your labs asap Try some of the shoulder exercises I gave you today- if not helpful you may want to do formal PT. Let me know in this case I will refer you back to Dr. Nelva Bush for a look at your back and perhaps another epidural injection  If you have a gout flare, use the colchicine as directed; 2 pills right away, 1 more an hour later There is some risk of interaction between colchicine and your cholesterol med- hold the cholesterol med for 1-2 days after using colchicine.     Health Maintenance, Male A healthy lifestyle and preventive care is important for your health and wellness. Ask your health care provider about what schedule of regular examinations is right for you. What should I know about weight and diet? Eat a Healthy Diet  Eat plenty of vegetables, fruits, whole grains, low-fat dairy products, and lean protein.  Do not eat a lot of foods high in solid fats, added sugars, or salt.  Maintain a Healthy Weight Regular exercise can help you achieve or maintain a healthy weight. You should:  Do at least 150 minutes of exercise each week. The exercise should increase your heart rate and make you sweat (moderate-intensity exercise).  Do strength-training exercises at least twice a week.  Watch Your Levels of Cholesterol and Blood Lipids  Have your blood tested for lipids and cholesterol every 5 years starting at 62 years of age. If you are at high risk for heart disease, you should start having your blood tested when you are 61 years old. You may need to have your cholesterol levels checked more often if: ? Your lipid or cholesterol levels are high. ? You are older than 61 years of age. ? You are at high risk for heart disease.  What should I know about cancer screening? Many types of cancers can be detected early and may often be prevented. Lung Cancer  You should be screened every year  for lung cancer if: ? You are a current smoker who has smoked for at least 30 years. ? You are a former smoker who has quit within the past 15 years.  Talk to your health care provider about your screening options, when you should start screening, and how often you should be screened.  Colorectal Cancer  Routine colorectal cancer screening usually begins at 61 years of age and should be repeated every 5-10 years until you are 61 years old. You may need to be screened more often if early forms of precancerous polyps or small growths are found. Your health care provider may recommend screening at an earlier age if you have risk factors for colon cancer.  Your health care provider may recommend using home test kits to check for hidden blood in the stool.  A small camera at the end of a tube can be used to examine your colon (sigmoidoscopy or colonoscopy). This checks for the earliest forms of colorectal cancer.  Prostate and Testicular Cancer  Depending on your age and overall health, your health care provider may do certain tests to screen for prostate and testicular cancer.  Talk to your health care provider about any symptoms or concerns you have about testicular or prostate cancer.  Skin Cancer  Check your skin from head to toe regularly.  Tell your health care provider about any new moles or changes in moles, especially if: ?  There is a change in a mole's size, shape, or color. ? You have a mole that is larger than a pencil eraser.  Always use sunscreen. Apply sunscreen liberally and repeat throughout the day.  Protect yourself by wearing long sleeves, pants, a wide-brimmed hat, and sunglasses when outside.  What should I know about heart disease, diabetes, and high blood pressure?  If you are 42-59 years of age, have your blood pressure checked every 3-5 years. If you are 6 years of age or older, have your blood pressure checked every year. You should have your blood pressure  measured twice-once when you are at a hospital or clinic, and once when you are not at a hospital or clinic. Record the average of the two measurements. To check your blood pressure when you are not at a hospital or clinic, you can use: ? An automated blood pressure machine at a pharmacy. ? A home blood pressure monitor.  Talk to your health care provider about your target blood pressure.  If you are between 77-54 years old, ask your health care provider if you should take aspirin to prevent heart disease.  Have regular diabetes screenings by checking your fasting blood sugar level. ? If you are at a normal weight and have a low risk for diabetes, have this test once every three years after the age of 25. ? If you are overweight and have a high risk for diabetes, consider being tested at a younger age or more often.  A one-time screening for abdominal aortic aneurysm (AAA) by ultrasound is recommended for men aged 35-75 years who are current or former smokers. What should I know about preventing infection? Hepatitis B If you have a higher risk for hepatitis B, you should be screened for this virus. Talk with your health care provider to find out if you are at risk for hepatitis B infection. Hepatitis C Blood testing is recommended for:  Everyone born from [birth year] through 1964.  Anyone with known risk factors for hepatitis C.  Sexually Transmitted Diseases (STDs)  You should be screened each year for STDs including gonorrhea and chlamydia if: ? You are sexually active and are younger than 61 years of age. ? You are older than 61 years of age and your health care provider tells you that you are at risk for this type of infection. ? Your sexual activity has changed since you were last screened and you are at an increased risk for chlamydia or gonorrhea. Ask your health care provider if you are at risk.  Talk with your health care provider about whether you are at high risk of being infected  with HIV. Your health care provider may recommend a prescription medicine to help prevent HIV infection.  What else can I do?  Schedule regular health, dental, and eye exams.  Stay current with your vaccines (immunizations).  Do not use any tobacco products, such as cigarettes, chewing tobacco, and e-cigarettes. If you need help quitting, ask your health care provider.  Limit alcohol intake to no more than 2 drinks per day. One drink equals 12 ounces of beer, 5 ounces of wine, or 1 ounces of hard liquor.  Do not use street drugs.  Do not share needles.  Ask your health care provider for help if you need support or information about quitting drugs.  Tell your health care provider if you often feel depressed.  Tell your health care provider if you have ever been abused or do not feel  safe at home. This information is not intended to replace advice given to you by your health care provider. Make sure you discuss any questions you have with your health care provider. Document Released: 08/26/2007 Document Revised: 10/27/2015 Document Reviewed: 12/01/2014 Elsevier Interactive Patient Education  [employer].

## 2017-05-18 ENCOUNTER — Other Ambulatory Visit: Payer: Self-pay | Admitting: Emergency Medicine

## 2017-05-18 DIAGNOSIS — M10079 Idiopathic gout, unspecified ankle and foot: Secondary | ICD-10-CM

## 2017-05-18 MED ORDER — COLCHICINE 0.6 MG PO TABS: ORAL_TABLET | ORAL | 0 refills | Status: AC

## 2017-05-19 ENCOUNTER — Encounter: Payer: Self-pay | Admitting: Gastroenterology

## 2017-06-06 ENCOUNTER — Other Ambulatory Visit: Payer: Self-pay | Admitting: Family Medicine

## 2017-06-10 ENCOUNTER — Encounter: Payer: Self-pay | Admitting: Family Medicine

## 2017-06-10 DIAGNOSIS — R109 Unspecified abdominal pain: Secondary | ICD-10-CM

## 2017-06-15 ENCOUNTER — Encounter: Payer: Self-pay | Admitting: Family Medicine

## 2017-06-15 DIAGNOSIS — M25571 Pain in right ankle and joints of right foot: Secondary | ICD-10-CM

## 2017-06-15 NOTE — Addendum Note (Signed)
Addended by: Lamar Blinks C on: 06/15/2017 04:42 PM   Modules accepted: Orders

## 2017-07-04 DIAGNOSIS — M65871 Other synovitis and tenosynovitis, right ankle and foot: Secondary | ICD-10-CM | POA: Diagnosis not present

## 2017-07-04 DIAGNOSIS — M25571 Pain in right ankle and joints of right foot: Secondary | ICD-10-CM | POA: Diagnosis not present

## 2017-07-04 DIAGNOSIS — M79671 Pain in right foot: Secondary | ICD-10-CM | POA: Diagnosis not present

## 2017-07-04 DIAGNOSIS — M19071 Primary osteoarthritis, right ankle and foot: Secondary | ICD-10-CM | POA: Diagnosis not present

## 2017-07-06 DIAGNOSIS — K4020 Bilateral inguinal hernia, without obstruction or gangrene, not specified as recurrent: Secondary | ICD-10-CM | POA: Diagnosis not present

## 2017-07-10 ENCOUNTER — Encounter: Payer: Self-pay | Admitting: Family Medicine

## 2017-07-10 ENCOUNTER — Encounter: Payer: Self-pay | Admitting: Gastroenterology

## 2017-07-12 ENCOUNTER — Other Ambulatory Visit (INDEPENDENT_AMBULATORY_CARE_PROVIDER_SITE_OTHER): Payer: 59

## 2017-07-12 ENCOUNTER — Encounter: Payer: Self-pay | Admitting: Family Medicine

## 2017-07-12 ENCOUNTER — Other Ambulatory Visit: Payer: 59

## 2017-07-12 DIAGNOSIS — R972 Elevated prostate specific antigen [PSA]: Secondary | ICD-10-CM

## 2017-07-12 LAB — PSA: PSA: 0.85 ng/mL (ref 0.10–4.00)

## 2017-07-20 ENCOUNTER — Other Ambulatory Visit: Payer: Self-pay | Admitting: General Surgery

## 2017-07-20 DIAGNOSIS — M7671 Peroneal tendinitis, right leg: Secondary | ICD-10-CM | POA: Diagnosis not present

## 2017-07-20 DIAGNOSIS — K4020 Bilateral inguinal hernia, without obstruction or gangrene, not specified as recurrent: Secondary | ICD-10-CM

## 2017-07-20 DIAGNOSIS — M12271 Villonodular synovitis (pigmented), right ankle and foot: Secondary | ICD-10-CM | POA: Diagnosis not present

## 2017-07-24 ENCOUNTER — Ambulatory Visit
Admission: RE | Admit: 2017-07-24 | Discharge: 2017-07-24 | Disposition: A | Discharge status: Home / Self Care | Payer: 59 | Source: Ambulatory Visit | Attending: General Surgery | Admitting: General Surgery

## 2017-07-24 DIAGNOSIS — K4020 Bilateral inguinal hernia, without obstruction or gangrene, not specified as recurrent: Secondary | ICD-10-CM

## 2017-07-24 DIAGNOSIS — N420 Calculus of prostate: Secondary | ICD-10-CM | POA: Diagnosis not present

## 2017-07-24 MED ORDER — IOPAMIDOL (ISOVUE-300) INJECTION 61%
100.0000 mL | Freq: Once | INTRAVENOUS | Status: AC | PRN
  Administered 2017-07-24: 100 mL via INTRAVENOUS

## 2017-08-01 DIAGNOSIS — K4020 Bilateral inguinal hernia, without obstruction or gangrene, not specified as recurrent: Secondary | ICD-10-CM | POA: Diagnosis not present

## 2017-08-01 DIAGNOSIS — N3289 Other specified disorders of bladder: Secondary | ICD-10-CM | POA: Diagnosis not present

## 2017-08-10 DIAGNOSIS — N401 Enlarged prostate with lower urinary tract symptoms: Secondary | ICD-10-CM | POA: Diagnosis not present

## 2017-08-10 DIAGNOSIS — R3915 Urgency of urination: Secondary | ICD-10-CM | POA: Diagnosis not present

## 2017-08-10 DIAGNOSIS — R3121 Asymptomatic microscopic hematuria: Secondary | ICD-10-CM | POA: Diagnosis not present

## 2017-08-10 DIAGNOSIS — R3912 Poor urinary stream: Secondary | ICD-10-CM | POA: Diagnosis not present

## 2017-08-22 ENCOUNTER — Other Ambulatory Visit: Payer: Self-pay

## 2017-08-27 DIAGNOSIS — N200 Calculus of kidney: Secondary | ICD-10-CM | POA: Diagnosis not present

## 2017-08-27 DIAGNOSIS — R3121 Asymptomatic microscopic hematuria: Secondary | ICD-10-CM | POA: Diagnosis not present

## 2017-09-06 ENCOUNTER — Ambulatory Visit (AMBULATORY_SURGERY_CENTER): Payer: Self-pay

## 2017-09-06 VITALS — Ht 70.0 in | Wt 169.4 lb

## 2017-09-06 DIAGNOSIS — Z1211 Encounter for screening for malignant neoplasm of colon: Secondary | ICD-10-CM

## 2017-09-06 DIAGNOSIS — R3121 Asymptomatic microscopic hematuria: Secondary | ICD-10-CM | POA: Diagnosis not present

## 2017-09-06 MED ORDER — NA SULFATE-K SULFATE-MG SULF 17.5-3.13-1.6 GM/177ML PO SOLN: 1.0000 | Freq: Once | ORAL | 0 refills | Status: AC

## 2017-09-06 NOTE — Progress Notes (Signed)
Denies allergies to eggs or soy products. Denies complication of anesthesia or sedation. Denies use of weight loss medication. Denies use of O2.   Emmi instructions declined.   A fifteen dollar coupon was given to patient for Suprep.

## 2017-09-10 ENCOUNTER — Other Ambulatory Visit: Payer: Self-pay | Admitting: Family Medicine

## 2017-09-10 ENCOUNTER — Encounter: Payer: Self-pay | Admitting: Gastroenterology

## 2017-09-10 ENCOUNTER — Encounter: Payer: Self-pay | Admitting: Family Medicine

## 2017-09-10 DIAGNOSIS — Z7184 Encounter for health counseling related to travel: Secondary | ICD-10-CM

## 2017-09-10 MED ORDER — SCOPOLAMINE 1 MG/3DAYS TD PT72: 1.0000 | MEDICATED_PATCH | TRANSDERMAL | 0 refills | Status: DC

## 2017-09-18 ENCOUNTER — Encounter: Payer: Self-pay | Admitting: Gastroenterology

## 2017-09-18 ENCOUNTER — Ambulatory Visit (AMBULATORY_SURGERY_CENTER): Payer: 59 | Admitting: Gastroenterology

## 2017-09-18 ENCOUNTER — Other Ambulatory Visit: Payer: Self-pay

## 2017-09-18 ENCOUNTER — Other Ambulatory Visit: Payer: Self-pay | Admitting: Family Medicine

## 2017-09-18 VITALS — BP 93/65 | HR 5 | Temp 97.7°F | Resp 11 | Ht 70.0 in | Wt 169.0 lb

## 2017-09-18 DIAGNOSIS — K621 Rectal polyp: Secondary | ICD-10-CM | POA: Diagnosis not present

## 2017-09-18 DIAGNOSIS — D128 Benign neoplasm of rectum: Secondary | ICD-10-CM

## 2017-09-18 DIAGNOSIS — Z1211 Encounter for screening for malignant neoplasm of colon: Secondary | ICD-10-CM

## 2017-09-18 MED ORDER — SODIUM CHLORIDE 0.9 % IV SOLN: 500.0000 mL | Freq: Once | INTRAVENOUS | Status: DC

## 2017-09-18 NOTE — Progress Notes (Signed)
Called to room to assist during endoscopic procedure.  Patient ID and intended procedure confirmed with present staff. Received instructions for my participation in the procedure from the performing physician.  

## 2017-09-18 NOTE — Progress Notes (Signed)
To PACU, VSS. Report to [email or web address] 

## 2017-09-18 NOTE — Op Note (Signed)
[facility] Patient Name: Darrell Hauk Procedure Date: 09/18/2017 8:58 AM MRN: [id number] Endoscopist: Mallie Mussel L. Loletha Carrow , MD Age: 62 Referring MD:  Date of Birth: Sep 26, 1955 Gender: Male Account #: [id number] Procedure:                Colonoscopy Indications:              Screening for colorectal malignant neoplasm (no                            polyps 05/2007) Medicines:                Monitored Anesthesia Care Procedure:                Pre-Anesthesia Assessment:                           - Prior to the procedure, a History and Physical                            was performed, and patient medications and                            allergies were reviewed. The patient's tolerance of                            previous anesthesia was also reviewed. The risks                            and benefits of the procedure and the sedation                            options and risks were discussed with the patient.                            All questions were answered, and informed consent                            was obtained. Prior Anticoagulants: The patient has                            taken no previous anticoagulant or antiplatelet                            agents. ASA Grade Assessment: II - A patient with                            mild systemic disease. After reviewing the risks                            and benefits, the patient was deemed in                            satisfactory condition to undergo the procedure.  After obtaining informed consent, the colonoscope                            was passed under direct vision. Throughout the                            procedure, the patient's blood pressure, pulse, and                            oxygen saturations were monitored continuously. The                            Colonoscope was introduced through the anus and                            advanced to the the cecum, identified by                             appendiceal orifice and ileocecal valve. The                            colonoscopy was performed without difficulty. The                            patient tolerated the procedure well. The quality                            of the bowel preparation was excellent. The                            ileocecal valve, appendiceal orifice, and rectum                            were photographed. The quality of the bowel                            preparation was evaluated using the BBPS [facility]                            Bowel Preparation Scale) with scores of: Right                            Colon = 3, Transverse Colon = 3 and Left Colon = 3                            (entire mucosa seen well with no residual staining,                            small fragments of stool or opaque liquid). The                            total BBPS score equals 9. Scope In: 9:06:02 AM Scope Out: 9:23:53 AM Scope Withdrawal Time: 0 hours 11  minutes 55 seconds  Total Procedure Duration: 0 hours 17 minutes 51 seconds  Findings:                 The perianal and digital rectal examinations were                            normal.                           The colon (entire examined portion) was moderately                            redundant. Advancing the scope required using                            manual pressure.                           A few small-mouthed diverticula were found in the                            left colon.                           A 2 mm polyp was found in the rectum. The polyp was                            sessile. The polyp was removed with a cold biopsy                            forceps. Resection and retrieval were complete.                           The exam was otherwise without abnormality on                            direct and retroflexion views. Complications:            No immediate complications. Estimated Blood Loss:     Estimated blood loss was  minimal. Impression:               - Redundant colon.                           - Diverticulosis in the left colon.                           - One 2 mm polyp in the rectum, removed with a cold                            biopsy forceps. Resected and retrieved.                           - The examination was otherwise normal on direct  and retroflexion views. Recommendation:           - Patient has a contact number available for                            emergencies. The signs and symptoms of potential                            delayed complications were discussed with the                            patient. Return to normal activities tomorrow.                            Written discharge instructions were provided to the                            patient.                           - Resume previous diet.                           - Continue present medications.                           - Await pathology results.                           - Repeat colonoscopy is recommended for                            surveillance. The colonoscopy date will be                            determined after pathology results from today's                            exam become available for review. Henry L. Loletha Carrow, MD 09/18/2017 9:28:19 AM This report has been signed electronically.

## 2017-09-18 NOTE — Progress Notes (Signed)
Patient c/o irritated watery left eye upon arrival to recovery. Patient reports no visual changes. Patient has been lying on his left side during procedure and the pillowcase could have possibly rubbed his eye. The nurse anesthetist has taken a look at it and concurs.

## 2017-09-18 NOTE — Patient Instructions (Signed)
YOU HAD AN ENDOSCOPIC PROCEDURE TODAY AT THE [facility]:   Refer to the procedure report that was given to you for any specific questions about what was found during the examination.  If the procedure report does not answer your questions, please call your gastroenterologist to clarify.  If you requested that your care partner not be given the details of your procedure findings, then the procedure report has been included in a sealed envelope for you to review at your convenience later.  YOU SHOULD EXPECT: Some feelings of bloating in the abdomen. Passage of more gas than usual.  Walking can help get rid of the air that was put into your GI tract during the procedure and reduce the bloating. If you had a lower endoscopy (such as a colonoscopy or flexible sigmoidoscopy) you may notice spotting of blood in your stool or on the toilet paper. If you underwent a bowel prep for your procedure, you may not have a normal bowel movement for a few days.  Please Note:  You might notice some irritation and congestion in your nose or some drainage.  This is from the oxygen used during your procedure.  There is no need for concern and it should clear up in a day or so.  SYMPTOMS TO REPORT IMMEDIATELY:   Following lower endoscopy (colonoscopy or flexible sigmoidoscopy):  Excessive amounts of blood in the stool  Significant tenderness or worsening of abdominal pains  Swelling of the abdomen that is new, acute  Fever of 100F or higher  For urgent or emergent issues, a gastroenterologist can be reached at any hour by calling [phone].   DIET:  We do recommend a small meal at first, but then you may proceed to your regular diet.  Drink plenty of fluids but you should avoid alcoholic beverages for 24 hours.  MEDICATIONS: Continue present medications.  Please see handouts given to you by your recovery nurse.  ACTIVITY:  You should plan to take it easy for the rest of today and you should NOT  DRIVE or use heavy machinery until tomorrow (because of the sedation medicines used during the test).    FOLLOW UP: Our staff will call the number listed on your records the next business day following your procedure to check on you and address any questions or concerns that you may have regarding the information given to you following your procedure. If we do not reach you, we will leave a message.  However, if you are feeling well and you are not experiencing any problems, there is no need to return our call.  We will assume that you have returned to your regular daily activities without incident.  If any biopsies were taken you will be contacted by phone or by letter within the next 1-3 weeks.  Please call us at [phone] if you have not heard about the biopsies in 3 weeks.   Thank you for allowing us to provide for your healthcare needs today.   SIGNATURES/CONFIDENTIALITY: You and/or your care partner have signed paperwork which will be entered into your electronic medical record.  These signatures attest to the fact that that the information above on your After Visit Summary has been reviewed and is understood.  Full responsibility of the confidentiality of this discharge information lies with you and/or your care-partner. 

## 2017-09-19 ENCOUNTER — Telehealth: Payer: Self-pay | Admitting: *Deleted

## 2017-09-19 NOTE — Telephone Encounter (Signed)
  Follow up Call-  Call back number 09/18/2017  Post procedure Call Back phone  # [phone]  Permission to leave phone message Yes  Some recent data might be hidden     Patient questions:  Do you have a fever, pain , or abdominal swelling? No. Pain Score  0 *  Have you tolerated food without any problems? Yes.    Have you been able to return to your normal activities? Yes.    Do you have any questions about your discharge instructions: Diet   No. Medications  No. Follow up visit  No.  Do you have questions or concerns about your Care? No.  Actions: * If pain score is 4 or above: No action needed, pain <4.

## 2017-09-21 ENCOUNTER — Encounter: Payer: Self-pay | Admitting: Gastroenterology

## 2017-11-27 ENCOUNTER — Encounter: Payer: Self-pay | Admitting: Family Medicine

## 2017-12-14 ENCOUNTER — Encounter: Payer: Self-pay | Admitting: Family Medicine

## 2017-12-14 DIAGNOSIS — M109 Gout, unspecified: Secondary | ICD-10-CM

## 2017-12-14 MED ORDER — ALLOPURINOL 300 MG PO TABS: 300.0000 mg | ORAL_TABLET | Freq: Every day | ORAL | 3 refills | Status: DC

## 2017-12-31 ENCOUNTER — Encounter: Payer: Self-pay | Admitting: Family Medicine

## 2018-01-07 ENCOUNTER — Ambulatory Visit (INDEPENDENT_AMBULATORY_CARE_PROVIDER_SITE_OTHER): Payer: 59 | Admitting: Sports Medicine

## 2018-01-07 ENCOUNTER — Encounter: Payer: Self-pay | Admitting: Sports Medicine

## 2018-01-07 VITALS — BP 108/82 | Ht 70.0 in | Wt 160.0 lb

## 2018-01-07 DIAGNOSIS — G8929 Other chronic pain: Secondary | ICD-10-CM | POA: Diagnosis not present

## 2018-01-07 DIAGNOSIS — M545 Low back pain: Secondary | ICD-10-CM | POA: Diagnosis not present

## 2018-01-07 NOTE — Progress Notes (Signed)
   Subjective:    Patient ID: Joshua [name], male    DOB: Dec 13, 1955, 62 y.o.   MRN: [id number]  HPI chief complaint: Low back pain  Very pleasant 62 year old runner comes in today complaining of several weeks of low back pain.  Localizes his pain primarily to the sacrum.  Pain will radiate occasionally across his low back.  It is most noticeable with running.  He also endorses stiffness and tightness across the area.  He was initially getting some discomfort in his hips but some iliopsoas stretching has resolved that.  He also had a CT scan of his pelvis done which showed no significant pathology in his hips.  400 mg of ibuprofen is helpful.  He is currently on prednisone for a dental procedure and the prednisone has completely eliminated his pain.  He denies radiating pain down the leg but he does have a history of a prior disc herniation which improved with a single epidural steroid injection.  He is a very competitive runner and has been able to continue running but has stopped racing for the time being.  Past medical history reviewed Medications reviewed Allergies reviewed    Review of Systems As above    Objective:   Physical Exam  Well-developed, well-nourished.  No acute distress.  Awake alert and oriented x3.  Vital signs reviewed.  Lumbar spine: Good lumbar range of motion.  No pain with forward flexion.  Slight pain with extension.  No tenderness to palpation.  No spasm. Hips: Smooth painless hip range of motion with a negative logroll.  Bilateral hip abductor weakness. Neurological exam: No atrophy.  Reflexes are equal at the Achilles and patellar tendons bilaterally.  Strength is 5/5 bilaterally.      Assessment & Plan:   Low back pain likely secondary to mild degenerative disc disease versus facet arthropathy  I would like to get some x-rays of his lumbar spine, sacrum, and coccyx.  He will start hip abductor strengthening and pelvic stabilizer exercises.  After I  review the x-rays we will add lumbar spine exercises as well.  Patient will follow-up with me in 6 weeks for reevaluation.  I think he is okay to continue running using pain as his guide.

## 2018-01-10 ENCOUNTER — Ambulatory Visit
Admission: RE | Admit: 2018-01-10 | Discharge: 2018-01-10 | Disposition: A | Discharge status: Home / Self Care | Payer: 59 | Source: Ambulatory Visit | Attending: Sports Medicine | Admitting: Sports Medicine

## 2018-01-10 DIAGNOSIS — M4807 Spinal stenosis, lumbosacral region: Secondary | ICD-10-CM | POA: Diagnosis not present

## 2018-01-10 DIAGNOSIS — M545 Low back pain, unspecified: Secondary | ICD-10-CM

## 2018-01-10 DIAGNOSIS — M160 Bilateral primary osteoarthritis of hip: Secondary | ICD-10-CM | POA: Diagnosis not present

## 2018-01-10 DIAGNOSIS — G8929 Other chronic pain: Secondary | ICD-10-CM

## 2018-01-14 ENCOUNTER — Telehealth: Payer: Self-pay | Admitting: Sports Medicine

## 2018-01-14 NOTE — Telephone Encounter (Signed)
  I spoke with Joshua [name] on the phone today after reviewing x-rays of his lumbar spine, sacrum, and coccyx.  He has some mild disc space narrowing at L5-S1 but otherwise x-rays are unremarkable.  I have advised him to continue with his home exercises and follow-up with me as scheduled.  If symptoms persist then we could consider merits of formal physical therapy.

## 2018-02-18 ENCOUNTER — Ambulatory Visit (INDEPENDENT_AMBULATORY_CARE_PROVIDER_SITE_OTHER): Payer: 59 | Admitting: Sports Medicine

## 2018-02-18 ENCOUNTER — Encounter: Payer: Self-pay | Admitting: Sports Medicine

## 2018-02-18 VITALS — BP 124/82 | Ht 70.0 in | Wt 165.0 lb

## 2018-02-18 DIAGNOSIS — M545 Low back pain, unspecified: Secondary | ICD-10-CM

## 2018-02-18 NOTE — Progress Notes (Signed)
   HPI Patient with history of nonradiating lower back pain.  Presenting for follow-up after having consistent improvement until the last 3 or 4 days which have been worse for him with changes in position.  He is still been able to go on runs up to 10 miles per week has been running up to 20/week prior to that.  No difficulties running.  No radiation.  No particular position is worse for him just the change to position.  No increased weakness in his legs.  He has been doing all of his therapeutic exercises including abductor raises, which he feels are his weakest exercise but he has been getting stronger going from 10-15 reps per day.  He identifies no specific moment of injury, or dramatic change in exercise that would cause this event.  Prior imaging did show compression of disc L5-S1.  He has a distant history of bulging disc which was treated with epidural injection and he since recovered.  CC: Nonradiating lower back pain  Medications/Interventions Tried: Alternating Tylenol, ibuprofen, home exercises  See HPI and/or previous note for associated ROS.  Objective: BP 124/82   Ht 5\' 10"  (1.778 m)   Wt 165 lb (74.8 kg)   BMI 23.68 kg/m  Gen: NAD, well groomed, a/o x3, normal affect, thin CV: Well-perfused. Warm.  Resp: Non-labored.  Neuro: Sensation intact throughout. No gross coordination deficits.  Gait: Nonpathologic posture MSK: No visible asymmetry or bruising to lower back.  No palpable abnormalities or muscle spasm.  Normal and symmetric range of motion with no claimed pain.  Distal strength within normal limits.  And symmetrical.  Lumbar back and hip appear stable to exam.  No pain with flexion, extension, left or right side bend of lower back.  No pain with flexion extension and internal/external rotation of bilateral hips.  Lower extremity pulses within normal limits, sensation intact and symmetrical bilaterally  Assessment and plan:  Lumbar back pain Patient here for 6-week  follow-up for lumbar back pain, nonradiating.  Imaging indicates compression of L5-S1 disc.  Patient still able to run 10 to 20 miles per week with only mild discomfort, will send to formal PT.  And follow-up as needed in a few weeks.   No orders of the defined types were placed in this encounter.   No orders of the defined types were placed in this encounter.    Sherene Sires, DO  PGY2 [place] 02/18/2018 9:36 AM    Patient seen and evaluated with the resident.  I agree with the above plan of care.  Patient will start formal physical therapy at [facility] and follow-up with me in 4 weeks.  If symptoms persist consider merits of further diagnostic imaging.  I think he is okay to continue with activity using pain as his guide, including running.

## 2018-02-18 NOTE — Assessment & Plan Note (Signed)
Patient here for 6-week follow-up for lumbar back pain, nonradiating.  Imaging indicates compression of L5-S1 disc.  Patient still able to run 10 to 20 miles per week with only mild discomfort, will send to formal PT.  And follow-up as needed in a few weeks.

## 2018-03-11 DIAGNOSIS — M545 Low back pain: Secondary | ICD-10-CM | POA: Diagnosis not present

## 2018-03-18 ENCOUNTER — Ambulatory Visit (INDEPENDENT_AMBULATORY_CARE_PROVIDER_SITE_OTHER): Payer: 59 | Admitting: Sports Medicine

## 2018-03-18 ENCOUNTER — Encounter: Payer: Self-pay | Admitting: Sports Medicine

## 2018-03-18 VITALS — BP 130/82 | Ht 70.0 in | Wt 164.0 lb

## 2018-03-18 DIAGNOSIS — M545 Low back pain, unspecified: Secondary | ICD-10-CM

## 2018-03-18 NOTE — Progress Notes (Addendum)
    Subjective:  Joshua [name] is a 63 y.o. male [facility] pain while running .   HPI:  Patient experiencing back pain while running.  Has history of degenerative disc disease L5-S1 as seen on MRI in 2014.  Asymptomatic herniation of L2-L3 patient has been to PT x1.  Has not noticed much improvement.  Patient describes running through "peanut butter "with back stiffness and pain.  Pain is worse when bending forward.  Improved when extending the back.  Patient has pain localized in the lower back bilaterally with a little bit of radiation into the upper buttocks.  Patient does not have significant pain when seated for long time.   Objective:  Physical Exam: BP 130/82   Ht 5\' 10"  (1.778 m)   Wt 164 lb (74.4 kg)   BMI 23.53 kg/m    Inspection: Lower back, no gross deformities Palpation: Mildly tender lumbar region mid spine ROM: Patient has full range of motion, pain with flexion Strength: Normal strength Stability: Normal stability Special Test: None Neurovascular Studies: MRI as above, MR pelvis September 02, 2017 no noted MSK abnormalities, x-ray lumbar spine L5-S1 disc narrowing,   No results found for this or any previous visit (from the past 72 hour(s)).   Assessment/Plan:  Lumbar back pain Patient following up on lumbar back pain.  Likely experiencing degenerative disc disease L5-S1 distribution.  Recommend continuing PT.  Will obtain MRI to assess for but as possible facet abnormalities.  Pending MRI results, will have patient follow-up.   Lab Orders  No laboratory test(s) ordered today   [name], MD, MS FAMILY MEDICINE RESIDENT - PGY2 03/18/2018 9:11 AM   Patient seen and evaluated with the resident.  I agree with the above plan of care.  Patient will continue with physical therapy.  We will get an MRI of his lumbar spine to further evaluate the degree of degenerative disc disease present.  He does not have true radiculopathy but  some of his pain may be facet mediated.  We may discuss merits of a diagnostic/therapeutic facet injection down the road if he does not improve with formal PT.  Phone follow-up with MRI results when available.  We will decide on follow-up based on those results.

## 2018-03-18 NOTE — Addendum Note (Signed)
Addended by: Lilia Argue R on: 03/18/2018 09:23 AM   Modules accepted: Level of Service

## 2018-03-18 NOTE — Assessment & Plan Note (Signed)
Patient following up on lumbar back pain.  Likely experiencing degenerative disc disease L5-S1 distribution.  Recommend continuing PT.  Will obtain MRI to assess for but as possible facet abnormalities.  Pending MRI results, will have patient follow-up.

## 2018-03-19 DIAGNOSIS — M545 Low back pain: Secondary | ICD-10-CM | POA: Diagnosis not present

## 2018-03-22 ENCOUNTER — Ambulatory Visit
Admission: RE | Admit: 2018-03-22 | Discharge: 2018-03-22 | Disposition: A | Discharge status: Home / Self Care | Payer: 59 | Source: Ambulatory Visit | Attending: Sports Medicine | Admitting: Sports Medicine

## 2018-03-22 DIAGNOSIS — M545 Low back pain, unspecified: Secondary | ICD-10-CM

## 2018-03-22 DIAGNOSIS — M48061 Spinal stenosis, lumbar region without neurogenic claudication: Secondary | ICD-10-CM | POA: Diagnosis not present

## 2018-03-23 NOTE — Progress Notes (Addendum)
[facility] [address] [place], [place] [address] [phone]  Date:  03/28/2018   Name:  Joshua [name]   DOB:  Jul 13, 1955   MRN:  [id number]  PCP:  Darreld Mclean, MD    Chief Complaint: Annual Exam   History of Present Illness:  Joshua [name] is a 63 y.o. very pleasant male patient who presents with the following:  Here today for physical exam.  History of hypertension, hyperlipidemia, chronic kidney disease, gout, prediabetes. He enjoys running for exercise-this is a major part of his life Married to [name], his second wife, who he met through his running group.  His first wife passed away  He has had some lumbar spine issues, but is doing PT and this has helped a lot.   He was able to run 15 miles in a race over this past weekend.  He sees Dr. Micheline Chapman with sports medicine, who is mostly helping him with his back right now He does have some nagging right shoulder and right knee pain, but is able to live with these for the time being  Shingrix: mentioned this to him  He is thinking about this -he does relate having shingles years ago with neuropathic pain.  He would like to go ahead and start Shingrix Labs: Due for complete lab work today, he is fasting His PSA had trended up last year, but was much better on recheck Colon: He had a colonoscopy last summer  Flu shot: done in September   Allopurinol 300 daily Lipitor 40 daily Benazepril 2.5 daily HCTZ 25 daily As needed colchicine Lab Results  Component Value Date   PSA 0.85 07/12/2017   PSA 3.43 05/03/2017   PSA 1.12 07/10/2016    Patient Active Problem List   Diagnosis Date Noted  . Lumbar back pain 02/18/2018  . Acute non-recurrent frontal sinusitis 04/17/2017  . Chronic neck pain 09/06/2014  . Other and unspecified hyperlipidemia 12/02/2013  . HTN (hypertension) 12/02/2013  . Gout 12/02/2013  . Meralgia paresthetica of right side  12/25/2012  . Seasonal allergic rhinitis 07/20/2012    Past Medical History:  Diagnosis Date  . Allergy   . Arthritis   . Asthma   . Chronic kidney disease   . GERD (gastroesophageal reflux disease)   . Gout   . Hyperlipidemia   . Hypertension   . Kidney stones     Past Surgical History:  Procedure Laterality Date  . kidney stones    . LITHOTRIPSY      Social History   Tobacco Use  . Smoking status: Never Smoker  . Smokeless tobacco: Never Used  Substance Use Topics  . Alcohol use: Yes    Comment: Occasional  . Drug use: No    Family History  Problem Relation Age of Onset  . Hyperlipidemia Mother   . Diabetes Mother   . Pancreatic cancer Mother   . Alcohol abuse Father   . Alcohol abuse Brother   . Drug abuse Brother   . Alcohol abuse Maternal Grandmother   . Hypertension Maternal Grandfather   . Hypertension Paternal Grandfather   . Cancer Sister   . Cancer Paternal Aunt   . Colon cancer Paternal Aunt   . Esophageal cancer Neg Hx   . Liver cancer Neg Hx   . Rectal cancer Neg Hx   . Stomach cancer Neg Hx     Allergies  Allergen Reactions  . Aspirin Anaphylaxis  .  Shrimp [Shellfish Allergy] Itching and Other (See Comments)    Almond cherry Shrimp causes stomach upset, tingling of tongue and throat sometimes  . Benadryl [Diphenhydramine Hcl] Rash    Allergic to the topical only  . Other Itching    almonds    Medication list has been reviewed and updated.  Current Outpatient Medications on File Prior to Visit  Medication Sig Dispense Refill  . acetaminophen (TYLENOL) 500 MG tablet Take 500 mg by mouth every 6 (six) hours as needed.    Marland Kitchen allopurinol (ZYLOPRIM) 300 MG tablet Take 1 tablet (300 mg total) by mouth daily. 90 tablet 3  . atorvastatin (LIPITOR) 40 MG tablet Take 1 tablet (40 mg total) by mouth daily. 90 tablet 3  . benazepril (LOTENSIN) 5 MG tablet Take 1/2 tablet (2.42m) by mouth once daily 45 tablet 3  . colchicine 0.6 MG tablet Take  2 pills at first sign of gout, then take 1 pil an hour later 45 tablet 0  . hydrochlorothiazide (HYDRODIURIL) 25 MG tablet Take 1 tablet (25 mg total) by mouth daily. 90 tablet 3  . ibuprofen (ADVIL,MOTRIN) 200 MG tablet Take 600 mg by mouth every 6 (six) hours as needed for pain.     Current Facility-Administered Medications on File Prior to Visit  Medication Dose Route Frequency Provider Last Rate Last Dose  . 0.9 %  sodium chloride infusion  500 mL Intravenous Once DDoran Stabler MD        Review of Systems:  As per HPI- otherwise negative. No chest pain or shortness of breath with exercise. No urinary symptoms, or erectile dysfunction.  No testicular lumps or bumps. He would be interested in having a skin check, though he does not have any particularly worrisome moles  Physical Examination: Vitals:   03/28/18 0848  BP: 136/78  Pulse: 73  Resp: 16  Temp: 98.4 F (36.9 C)  SpO2: 100%   Vitals:   03/28/18 0848  Weight: 170 lb (77.1 kg)  Height: '5\' 10"'  (1.778 m)   Body mass index is 24.39 kg/m. Ideal Body Weight: Weight in (lb) to have BMI = 25: 173.9  GEN: WDWN, NAD, Non-toxic, A & O x 3, normal weight, very fit for age HEENT: Atraumatic, Normocephalic. Neck supple. No masses, No LAD.  Bilateral TM wnl, oropharynx normal.  PEERL,EOMI.   Ears and Nose: No external deformity. CV: RRR, No M/G/R. No JVD. No thrill. No extra heart sounds. PULM: CTA B, no wheezes, crackles, rhonchi. No retractions. No resp. distress. No accessory muscle use. ABD: S, NT, ND, +BS. No rebound. No HSM. EXTR: No c/c/e NEURO Normal gait.  PSYCH: Normally interactive. Conversant. Not depressed or anxious appearing.  Calm demeanor.  Right shoulder shows full range of motion.  He has some discomfort with reaching behind him, like if he were to reach in the backseat of his car.  Otherwise his strength and range of motion are normal No unusual skin findings   Assessment and Plan: Physical  exam  Increased prostate specific antigen (PSA) velocity - Plan: PSA  Hyperlipidemia, unspecified hyperlipidemia type - Plan: Lipid panel  Pre-diabetes - Plan: Hemoglobin A1c  Idiopathic chronic gout of multiple sites without tophus  Essential hypertension - Plan: CBC, Comprehensive metabolic panel  Screening for deficiency anemia - Plan: CBC  Immunization due - Plan: Varicella-zoster vaccine IM (Shingrix)  Screening for skin cancer - Plan: Ambulatory referral to Dermatology  Here today for a physical.  DTimmothy Soursis overall doing very  well. He does have some musculoskeletal aches and pains, which are somewhat related to his high mileage running.  He has a sports medicine doctor who is helping him Labs pain as above Blood pressure under control, continue current medications His gout has not been troublesome, continue allopurinol Referral to Derm for skin check Shingrix No. 1 administered  Signed Lamar Blinks, MD  Received his labs, message today  Results for orders placed or performed in visit on 03/28/18  CBC  Result Value Ref Range   WBC 5.6 4.0 - 10.5 K/uL   RBC 5.01 4.22 - 5.81 Mil/uL   Platelets 197.0 150.0 - 400.0 K/uL   Hemoglobin 15.1 13.0 - 17.0 g/dL   HCT 43.3 39.0 - 52.0 %   MCV 86.4 78.0 - 100.0 fl   MCHC 34.9 30.0 - 36.0 g/dL   RDW 13.4 11.5 - 15.5 %  Comprehensive metabolic panel  Result Value Ref Range   Sodium 139 135 - 145 mEq/L   Potassium 3.9 3.5 - 5.1 mEq/L   Chloride 103 96 - 112 mEq/L   CO2 30 19 - 32 mEq/L   Glucose, Bld 91 70 - 99 mg/dL   BUN 10 6 - 23 mg/dL   Creatinine, Ser 0.92 0.40 - 1.50 mg/dL   Total Bilirubin 0.7 0.2 - 1.2 mg/dL   Alkaline Phosphatase 70 39 - 117 U/L   AST 26 0 - 37 U/L   ALT 23 0 - 53 U/L   Total Protein 6.6 6.0 - 8.3 g/dL   Albumin 4.6 3.5 - 5.2 g/dL   Calcium 9.5 8.4 - 10.5 mg/dL   GFR 88.87 >60.00 mL/min  Hemoglobin A1c  Result Value Ref Range   Hgb A1c MFr Bld 5.7 4.6 - 6.5 %  Lipid panel  Result Value Ref  Range   Cholesterol 142 0 - 200 mg/dL   Triglycerides 90.0 0.0 - 149.0 mg/dL   HDL 53.70 >39.00 mg/dL   VLDL 18.0 0.0 - 40.0 mg/dL   LDL Cholesterol 71 0 - 99 mg/dL   Total CHOL/HDL Ratio 3    NonHDL 88.72   PSA  Result Value Ref Range   PSA 1.18 0.10 - 4.00 ng/mL   Lab Results  Component Value Date   PSA 1.18 03/28/2018   PSA 0.85 07/12/2017   PSA 3.43 05/03/2017

## 2018-03-26 ENCOUNTER — Telehealth: Payer: Self-pay | Admitting: Sports Medicine

## 2018-03-26 DIAGNOSIS — M545 Low back pain: Secondary | ICD-10-CM | POA: Diagnosis not present

## 2018-03-26 NOTE — Telephone Encounter (Signed)
  I spoke with Joshua [name] on the phone today after reviewing the MRI of his lumbar spine.  Dominant abnormality is at L5-S1 where there is a central disc protrusion which extends to the right foramen resulting in significant foraminal narrowing.  He continues to deny numbness, tingling, or pain down the right leg.  He has noticed tremendous improvement with physical therapy.  In fact, he was able to run a 15 mile trail race this past weekend with no pain.  I recommended that he continue with formal physical therapy until the therapist feels that he is ready for discharge.  I did discuss the possibility of a lumbar epidural steroid injection but his lack of radiculopathy makes the success of that injection less likely.  He will follow-up with me as needed.

## 2018-03-28 ENCOUNTER — Encounter: Payer: Self-pay | Admitting: Family Medicine

## 2018-03-28 ENCOUNTER — Ambulatory Visit (INDEPENDENT_AMBULATORY_CARE_PROVIDER_SITE_OTHER): Payer: 59 | Admitting: Family Medicine

## 2018-03-28 VITALS — BP 136/78 | HR 73 | Temp 98.4°F | Resp 16 | Ht 70.0 in | Wt 170.0 lb

## 2018-03-28 DIAGNOSIS — R972 Elevated prostate specific antigen [PSA]: Secondary | ICD-10-CM | POA: Diagnosis not present

## 2018-03-28 DIAGNOSIS — Z1283 Encounter for screening for malignant neoplasm of skin: Secondary | ICD-10-CM

## 2018-03-28 DIAGNOSIS — M1A09X0 Idiopathic chronic gout, multiple sites, without tophus (tophi): Secondary | ICD-10-CM

## 2018-03-28 DIAGNOSIS — Z130 Encounter for screening for diseases of the blood and blood-forming organs and certain disorders involving the immune mechanism: Secondary | ICD-10-CM

## 2018-03-28 DIAGNOSIS — E785 Hyperlipidemia, unspecified: Secondary | ICD-10-CM | POA: Diagnosis not present

## 2018-03-28 DIAGNOSIS — Z0000 Encounter for general adult medical examination without abnormal findings: Secondary | ICD-10-CM | POA: Diagnosis not present

## 2018-03-28 DIAGNOSIS — Z23 Encounter for immunization: Secondary | ICD-10-CM

## 2018-03-28 DIAGNOSIS — I10 Essential (primary) hypertension: Secondary | ICD-10-CM

## 2018-03-28 DIAGNOSIS — R7303 Prediabetes: Secondary | ICD-10-CM | POA: Diagnosis not present

## 2018-03-28 LAB — LIPID PANEL
CHOL/HDL RATIO: 3
CHOLESTEROL: 142 mg/dL (ref 0–200)
HDL: 53.7 mg/dL (ref >39.00)
LDL Cholesterol: 71 mg/dL (ref 0–99)
NonHDL: 88.72
TRIGLYCERIDES: 90 mg/dL (ref 0.0–149.0)
VLDL: 18 mg/dL (ref 0.0–40.0)

## 2018-03-28 LAB — COMPREHENSIVE METABOLIC PANEL
ALK PHOS: 70 U/L (ref 39–117)
ALT: 23 U/L (ref 0–53)
AST: 26 U/L (ref 0–37)
Albumin: 4.6 g/dL (ref 3.5–5.2)
BUN: 10 mg/dL (ref 6–23)
CALCIUM: 9.5 mg/dL (ref 8.4–10.5)
CO2: 30 mEq/L (ref 19–32)
Chloride: 103 mEq/L (ref 96–112)
Creatinine, Ser: 0.92 mg/dL (ref 0.40–1.50)
GFR: 88.87 mL/min (ref >60.00)
GLUCOSE: 91 mg/dL (ref 70–99)
POTASSIUM: 3.9 meq/L (ref 3.5–5.1)
Sodium: 139 mEq/L (ref 135–145)
TOTAL PROTEIN: 6.6 g/dL (ref 6.0–8.3)
Total Bilirubin: 0.7 mg/dL (ref 0.2–1.2)

## 2018-03-28 LAB — PSA: PSA: 1.18 ng/mL (ref 0.10–4.00)

## 2018-03-28 LAB — HEMOGLOBIN A1C: Hgb A1c MFr Bld: 5.7 % (ref 4.6–6.5)

## 2018-03-28 LAB — CBC
HEMATOCRIT: 43.3 % (ref 39.0–52.0)
HEMOGLOBIN: 15.1 g/dL (ref 13.0–17.0)
MCHC: 34.9 g/dL (ref 30.0–36.0)
MCV: 86.4 fl (ref 78.0–100.0)
Platelets: 197 10*3/uL (ref 150.0–400.0)
RBC: 5.01 Mil/uL (ref 4.22–5.81)
RDW: 13.4 % (ref 11.5–15.5)
WBC: 5.6 10*3/uL (ref 4.0–10.5)

## 2018-03-28 NOTE — Patient Instructions (Signed)
It was a pleasure to see you today, as always.  I am glad that your back is doing okay.  Keep an eye on your other aches and pains, if any of the become more prominent myself or Dr. Micheline Chapman will be glad to look at them for you  We refer you to [facility] dermatology, on [facility].  They can do a complete skin check to screen for skin cancer We will continue current medications, I am glad to refill when these come do Let us recheck a PSA today, to follow any trend.  I will be in touch with this report as well as your other labs  You got your first shingles vaccine today, second dose will be due in 2 to 6 months.  This can be done as a nurse visit  Continue your excellent lifestyle and exercise habits   Health Maintenance, Male A healthy lifestyle and preventive care is important for your health and wellness. Ask your health care provider about what schedule of regular examinations is right for you. What should I know about weight and diet? Eat a Healthy Diet  Eat plenty of vegetables, fruits, whole grains, low-fat dairy products, and lean protein.  Do not eat a lot of foods high in solid fats, added sugars, or salt.  Maintain a Healthy Weight Regular exercise can help you achieve or maintain a healthy weight. You should:  Do at least 150 minutes of exercise each week. The exercise should increase your heart rate and make you sweat (moderate-intensity exercise).  Do strength-training exercises at least twice a week. Watch Your Levels of Cholesterol and Blood Lipids  Have your blood tested for lipids and cholesterol every 5 years starting at 62 years of age. If you are at high risk for heart disease, you should start having your blood tested when you are 62 years old. You may need to have your cholesterol levels checked more often if: ? Your lipid or cholesterol levels are high. ? You are older than 62 years of age. ? You are at high risk for heart disease. What should I know about  cancer screening? Many types of cancers can be detected early and may often be prevented. Lung Cancer  You should be screened every year for lung cancer if: ? You are a current smoker who has smoked for at least 30 years. ? You are a former smoker who has quit within the past 15 years.  Talk to your health care provider about your screening options, when you should start screening, and how often you should be screened. Colorectal Cancer  Routine colorectal cancer screening usually begins at 62 years of age and should be repeated every 5-10 years until you are 62 years old. You may need to be screened more often if early forms of precancerous polyps or small growths are found. Your health care provider may recommend screening at an earlier age if you have risk factors for colon cancer.  Your health care provider may recommend using home test kits to check for hidden blood in the stool.  A small camera at the end of a tube can be used to examine your colon (sigmoidoscopy or colonoscopy). This checks for the earliest forms of colorectal cancer. Prostate and Testicular Cancer  Depending on your age and overall health, your health care provider may do certain tests to screen for prostate and testicular cancer.  Talk to your health care provider about any symptoms or concerns you have about testicular or  prostate cancer. Skin Cancer  Check your skin from head to toe regularly.  Tell your health care provider about any new moles or changes in moles, especially if: ? There is a change in a mole's size, shape, or color. ? You have a mole that is larger than a pencil eraser.  Always use sunscreen. Apply sunscreen liberally and repeat throughout the day.  Protect yourself by wearing long sleeves, pants, a wide-brimmed hat, and sunglasses when outside. What should I know about heart disease, diabetes, and high blood pressure?  If you are 22-11 years of age, have your blood pressure checked  every 3-5 years. If you are 77 years of age or older, have your blood pressure checked every year. You should have your blood pressure measured twice-once when you are at a hospital or clinic, and once when you are not at a hospital or clinic. Record the average of the two measurements. To check your blood pressure when you are not at a hospital or clinic, you can use: ? An automated blood pressure machine at a pharmacy. ? A home blood pressure monitor.  Talk to your health care provider about your target blood pressure.  If you are between 74-61 years old, ask your health care provider if you should take aspirin to prevent heart disease.  Have regular diabetes screenings by checking your fasting blood sugar level. ? If you are at a normal weight and have a low risk for diabetes, have this test once every three years after the age of 41. ? If you are overweight and have a high risk for diabetes, consider being tested at a younger age or more often.  A one-time screening for abdominal aortic aneurysm (AAA) by ultrasound is recommended for men aged 51-75 years who are current or former smokers. What should I know about preventing infection? Hepatitis B If you have a higher risk for hepatitis B, you should be screened for this virus. Talk with your health care provider to find out if you are at risk for hepatitis B infection. Hepatitis C Blood testing is recommended for:  Everyone born from [birth year] through 1964.  Anyone with known risk factors for hepatitis C. Sexually Transmitted Diseases (STDs)  You should be screened each year for STDs including gonorrhea and chlamydia if: ? You are sexually active and are younger than 62 years of age. ? You are older than 62 years of age and your health care provider tells you that you are at risk for this type of infection. ? Your sexual activity has changed since you were last screened and you are at an increased risk for chlamydia or gonorrhea. Ask your  health care provider if you are at risk.  Talk with your health care provider about whether you are at high risk of being infected with HIV. Your health care provider may recommend a prescription medicine to help prevent HIV infection. What else can I do?  Schedule regular health, dental, and eye exams.  Stay current with your vaccines (immunizations).  Do not use any tobacco products, such as cigarettes, chewing tobacco, and e-cigarettes. If you need help quitting, ask your health care provider.  Limit alcohol intake to no more than 2 drinks per day. One drink equals 12 ounces of beer, 5 ounces of wine, or 1 ounces of hard liquor.  Do not use street drugs.  Do not share needles.  Ask your health care provider for help if you need support or information about quitting drugs.  Tell your health care provider if you often feel depressed.  Tell your health care provider if you have ever been abused or do not feel safe at home. This information is not intended to replace advice given to you by your health care provider. Make sure you discuss any questions you have with your health care provider. Document Released: 08/26/2007 Document Revised: 10/27/2015 Document Reviewed: 12/01/2014 Elsevier Interactive Patient Education  2019 [employer].

## 2018-04-02 DIAGNOSIS — M545 Low back pain: Secondary | ICD-10-CM | POA: Diagnosis not present

## 2018-05-27 ENCOUNTER — Encounter: Payer: Self-pay | Admitting: Family Medicine

## 2018-05-28 ENCOUNTER — Ambulatory Visit: Payer: 59

## 2018-06-10 ENCOUNTER — Telehealth: Payer: Self-pay | Admitting: Family Medicine

## 2018-06-10 ENCOUNTER — Encounter: Payer: Self-pay | Admitting: Family Medicine

## 2018-06-10 DIAGNOSIS — I10 Essential (primary) hypertension: Secondary | ICD-10-CM

## 2018-06-10 MED ORDER — BENAZEPRIL HCL 5 MG PO TABS: ORAL_TABLET | ORAL | 1 refills | Status: DC

## 2018-06-10 NOTE — Addendum Note (Signed)
Addended by: Kelle Darting A on: 06/10/2018 11:27 AM   Modules accepted: Orders

## 2018-06-10 NOTE — Telephone Encounter (Signed)
Pt called office and left a voicemail message on 06/10/18 at 7:17 am regarding refill of Benazepril. Medication refilled on 06/10/18. See MyChart message on 06/10/18

## 2018-06-27 DIAGNOSIS — D235 Other benign neoplasm of skin of trunk: Secondary | ICD-10-CM | POA: Diagnosis not present

## 2018-06-27 DIAGNOSIS — L821 Other seborrheic keratosis: Secondary | ICD-10-CM | POA: Diagnosis not present

## 2018-06-27 DIAGNOSIS — D225 Melanocytic nevi of trunk: Secondary | ICD-10-CM | POA: Diagnosis not present

## 2018-07-03 ENCOUNTER — Other Ambulatory Visit: Payer: Self-pay | Admitting: Family Medicine

## 2018-07-03 DIAGNOSIS — E785 Hyperlipidemia, unspecified: Secondary | ICD-10-CM

## 2018-07-09 ENCOUNTER — Ambulatory Visit (INDEPENDENT_AMBULATORY_CARE_PROVIDER_SITE_OTHER): Payer: 59 | Admitting: Sports Medicine

## 2018-07-09 ENCOUNTER — Other Ambulatory Visit: Payer: Self-pay

## 2018-07-09 VITALS — BP 136/64 | Ht 70.0 in | Wt 163.0 lb

## 2018-07-09 DIAGNOSIS — M25561 Pain in right knee: Secondary | ICD-10-CM | POA: Diagnosis not present

## 2018-07-09 NOTE — Progress Notes (Signed)
   Subjective:    Patient ID: Joshua [name], male    DOB: October 15, 1955, 63 y.o.   MRN: [id number]  HPI chief complaint: Right knee pain  Very pleasant 63 year old runner comes in today complaining of 2 months of right knee pain.  Pain began acutely while running down a hill.  He describes a sharp, stabbing pain along the lateral aspect of his knee that is most noticeable when running up or downhill.  He also notices pain with going up and down stairs and with running fast.  He notes that if he keeps his pace slower that he does not have pain.  It is very intermittent but quite painful when it occurs.  It has led to some instability in his knee with a feeling of wanting to give way.  He denies any true locking or catching.  He has not noticed any swelling.  He has had IT band syndrome in the past but states that this pain feels different.  No prior knee surgeries.  No numbness or tingling.  No real treatment to date.  Interim medical history reviewed Medications reviewed Allergies reviewed    Review of Systems As above    Objective:   Physical Exam  Well-developed, well-nourished.  No acute distress.  Awake alert and oriented x3.  Vital signs reviewed.  Right knee: Full range of motion.  No effusion.  No tenderness to palpation along medial or lateral joint lines.  No tenderness over the lateral femoral condyle.  Knee is stable to valgus and varus stressing.  Negative anterior drawer, negative posterior drawer.  Positive Thessaly's.  Neurovascularly intact distally.  Walking without a limp.  Right hip: Mild amount of hip abductor weakness when compared to the left hip  Brief limited bedside ultrasound of the right knee shows no joint effusion.  Visualized portion of the lateral meniscus is intact.      Assessment & Plan:   2 months of right knee pain possibly secondary to mild DJD versus occult lateral meniscal tear  We will get an x-ray of Don's knee.  I will call him with those  results when available.  In the meantime, I recommended that he start some isometric quad exercises.  He is already doing hip abductor exercises and I encouraged him to continue with those.  Follow-up with me in 3 to 4 weeks for check on his progress.  If he continues to have symptoms and his x-rays are unremarkable then we may need to consider merits of further diagnostic imaging.  In the meantime, I think he may continue to run using pain as his guide but he will need to try to avoid hills as much as possible.

## 2018-07-10 ENCOUNTER — Encounter: Payer: Self-pay | Admitting: Sports Medicine

## 2018-07-11 ENCOUNTER — Other Ambulatory Visit: Payer: Self-pay

## 2018-07-11 ENCOUNTER — Ambulatory Visit
Admission: RE | Admit: 2018-07-11 | Discharge: 2018-07-11 | Disposition: A | Discharge status: Home / Self Care | Payer: 59 | Source: Ambulatory Visit | Attending: Sports Medicine | Admitting: Sports Medicine

## 2018-07-11 DIAGNOSIS — M25561 Pain in right knee: Secondary | ICD-10-CM

## 2018-07-15 ENCOUNTER — Telehealth: Payer: Self-pay | Admitting: Sports Medicine

## 2018-07-15 NOTE — Telephone Encounter (Signed)
  I spoke with Joshua [name] on the phone today after reviewing x-rays of his knee.  He has very mild patellofemoral DJD but otherwise his knee looks healthy.  He is still endorsing some instability in the knee but the brace seems to be helping.  He has been doing his isometric quad exercises daily.  He will follow-up with me in 3 to 4 weeks for reevaluation.  If he continues to have instability or persistent pain despite increasing his quad strength, then we may need to consider merits of further diagnostic imaging to rule out a meniscal tear.

## 2018-07-24 ENCOUNTER — Other Ambulatory Visit: Payer: Self-pay | Admitting: Family Medicine

## 2018-07-24 DIAGNOSIS — I10 Essential (primary) hypertension: Secondary | ICD-10-CM

## 2018-08-06 ENCOUNTER — Ambulatory Visit (INDEPENDENT_AMBULATORY_CARE_PROVIDER_SITE_OTHER): Payer: 59 | Admitting: Sports Medicine

## 2018-08-06 ENCOUNTER — Other Ambulatory Visit: Payer: Self-pay

## 2018-08-06 ENCOUNTER — Encounter: Payer: Self-pay | Admitting: Sports Medicine

## 2018-08-06 VITALS — Ht 70.0 in | Wt 165.0 lb

## 2018-08-06 DIAGNOSIS — M25561 Pain in right knee: Secondary | ICD-10-CM

## 2018-08-06 NOTE — Progress Notes (Signed)
  This visit was provided via telemedicine Consent was obtained. Patient was located at home Physician was located in his office   I spoke with Joshua [name] today via Webex regarding follow-up on his right knee. He is still struggling with pain when running, especially if he increases his speed.  He also has pain if he missteps.  He continues to endorse feelings of instability in the knee despite being compliant with isometric quad strengthening at home.  Still has not noticed any swelling.  His x-rays show some mild degenerative changes but not severe.  Physical exam was impossible to perform due to the nature of this visit  Impression/plan:  Persistent right knee pain, rule out lateral meniscal tear: At this point in time I recommended that we proceed with an MRI scan to rule out an occult lateral meniscal tear not seen on his ultrasound.  Patient is in agreement with that plan.  Phone follow-up with those results when available.  We will delineate further treatment based on those findings.

## 2018-08-06 NOTE — Addendum Note (Signed)
Addended by: Cyd Silence on: 08/06/2018 01:56 PM   Modules accepted: Orders

## 2018-08-14 ENCOUNTER — Other Ambulatory Visit: Payer: Self-pay

## 2018-08-14 ENCOUNTER — Ambulatory Visit
Admission: RE | Admit: 2018-08-14 | Discharge: 2018-08-14 | Disposition: A | Discharge status: Home / Self Care | Payer: 59 | Source: Ambulatory Visit | Attending: Sports Medicine | Admitting: Sports Medicine

## 2018-08-14 DIAGNOSIS — M25561 Pain in right knee: Secondary | ICD-10-CM

## 2018-08-20 ENCOUNTER — Telehealth: Payer: Self-pay | Admitting: Sports Medicine

## 2018-08-20 NOTE — Telephone Encounter (Signed)
  I spoke with [name] on the phone yesterday after reviewing the MRI of his right knee.  His findings are consistent with arthritis of the lateral aspect of the patellofemoral compartment.  There is prominent edema in the lateral femoral condyle as well as subcortical cyst formation.  The rest of his knee, including the menisci, are unremarkable.  Patient will return to the office later this week to discuss these findings in greater detail.  We will likely attempt a single cortisone injection and coupled with a VMO strengthening program.  If he remains symptomatic afterwards, he may benefit from a lateral release.

## 2018-08-22 ENCOUNTER — Encounter: Payer: Self-pay | Admitting: Sports Medicine

## 2018-08-22 ENCOUNTER — Ambulatory Visit (INDEPENDENT_AMBULATORY_CARE_PROVIDER_SITE_OTHER): Payer: 59 | Admitting: Sports Medicine

## 2018-08-22 ENCOUNTER — Other Ambulatory Visit: Payer: Self-pay

## 2018-08-22 VITALS — BP 124/70 | Ht 70.0 in | Wt 162.0 lb

## 2018-08-22 DIAGNOSIS — M25561 Pain in right knee: Secondary | ICD-10-CM

## 2018-08-22 MED ORDER — METHYLPREDNISOLONE ACETATE 40 MG/ML IJ SUSP
40.0000 mg | Freq: Once | INTRAMUSCULAR | Status: AC
  Administered 2018-08-22: 40 mg via INTRA_ARTICULAR

## 2018-08-22 NOTE — Progress Notes (Signed)
  Joshua [name] - 63 y.o. male MRN [id number]  Date of birth: [birth year]    SUBJECTIVE:      Chief Complaint: Right knee pain  HPI:  Patient here for follow-up for right knee pain and steroid injection.   ROS:     See HPI. All other reviewed systems negative.  PERTINENT  PMH / PSH FH / / SH:  Past Medical, Surgical, Social, and Family History Reviewed & Updated in the EMR.    OBJECTIVE: BP 124/70   Ht 5\' 10"  (1.778 m)   Wt 162 lb (73.5 kg)   BMI 23.24 kg/m   Physical Exam:  Vital signs are reviewed.  GEN: Alert and oriented, NAD Pulm: Breathing unlabored PSY: normal mood, congruent affect  MSK: Right knee: - Inspection: No gross deformity. No effusion. No erythema or bruising. Skin intact - Palpation: no focal TTP - ROM: full active ROM with flexion and extension in knee - Strength: 5/5 strength - Neuro/vasc: NV intact   Procedure performed: knee intraarticular corticosteroid injection; palpation guided Consent obtained and verified. Time-out conducted. Noted no overlying erythema, induration, or other signs of local infection. The RIGHT medial joint space was palpated and marked. The overlying skin was prepped in a sterile fashion. Topical analgesic spray: Ethyl chloride. Joint: RIGHT knee Needle: 25GA, 1.5" Completed without difficulty. Meds: Depo-Medrol 40 mg, lidocaine 3 cc    ASSESSMENT & PLAN:  1.  Patient here today for steroid injection for his right knee.  Today we reviewed his MRI.  Steroid injection performed as described above.  We will see how he does with this.  He was also instructed on exercises to help strengthen the VMO.  He will follow-up in 4 weeks.  If his pain returns, we will then refer him to physical therapy and for taping.  If he responds well to the taping, he would be a good candidate for referral for lateral release.  Patient seen and evaluated with the sports medicine fellow.  I agree with the above plan of care.  I discussed this  case with Dr. Rhona Raider.  This patient may ultimately benefit from a lateral release but a trial of McConnell taping should be considered first.  I talked with the patient about that today in the office.  He would like to try the cortisone injection first.  Follow-up with me in 4 weeks.  If he remains symptomatic, then I will refer him to physical therapy.  We also gave him some VMO strengthening exercises for him to start doing at home.

## 2018-09-19 ENCOUNTER — Other Ambulatory Visit: Payer: Self-pay

## 2018-09-19 ENCOUNTER — Ambulatory Visit (INDEPENDENT_AMBULATORY_CARE_PROVIDER_SITE_OTHER): Payer: 59 | Admitting: Sports Medicine

## 2018-09-19 VITALS — BP 118/60

## 2018-09-19 DIAGNOSIS — M1711 Unilateral primary osteoarthritis, right knee: Secondary | ICD-10-CM

## 2018-09-19 NOTE — Progress Notes (Signed)
   Subjective:    Patient ID: Joshua [name], male    DOB: August 22, 1955, 63 y.o.   MRN: [id number]  HPI   Joshua [name] comes in today for follow-up on right knee pain secondary to early patellofemoral osteoarthritis.  He did receive some limited benefit from the cortisone injection administered at his last visit.  Unfortunately, his pain has returned.  It is most noticeable with running and going up and down stairs.  He especially notices it when trying to run fast.  He notes his pain is much less when he runs at a slower pace.  He has not noticed any swelling.   Review of Systems As above    Objective:   Physical Exam  Well-developed, well-nourished.  No acute distress.  Awake alert and oriented x3.  Vital signs reviewed.  Right knee: Full range of motion.  No effusion.  Minimal patellofemoral crepitus.  No tenderness to palpation.  Neurovascularly intact distally.  Recent MRI of the right knee showed findings consistent osteoarthritis of the lateral patellofemoral joint.      Assessment & Plan:   Persistent right knee pain secondary to patellofemoral osteoarthritis  Before referring to Dr. Rhona Raider for consideration of a lateral release, I would like to first send the patient to physical therapy for trial of McConnell taping.  I have asked the patient to follow-up with me via telephone in 3 to 4 weeks.  If McConnell taping is helpful then he may benefit from a lateral release.  Patient is in agreement with this plan.

## 2018-09-30 ENCOUNTER — Encounter: Payer: Self-pay | Admitting: Family Medicine

## 2018-09-30 ENCOUNTER — Other Ambulatory Visit: Payer: Self-pay

## 2018-09-30 ENCOUNTER — Ambulatory Visit: Payer: Self-pay

## 2018-09-30 ENCOUNTER — Ambulatory Visit (INDEPENDENT_AMBULATORY_CARE_PROVIDER_SITE_OTHER): Payer: 59 | Admitting: Family Medicine

## 2018-09-30 VITALS — BP 128/70 | Ht 70.0 in | Wt 162.0 lb

## 2018-09-30 DIAGNOSIS — M25561 Pain in right knee: Secondary | ICD-10-CM | POA: Insufficient documentation

## 2018-09-30 NOTE — Assessment & Plan Note (Addendum)
History and physical exam concerning for meniscal damage that may not have shown up on MRI. Discussed management options including aspiration and cortisone injection vs referral to orthopedic surgery vs continued McConnel taping for another 2-3 weeks. Patient opted for referral to ortho surg. Will plan to follow up pending evaluation by ortho. Until then, activity as tolerated and OTC oral/topical analgesics PRN.

## 2018-09-30 NOTE — Progress Notes (Signed)
   PCP: Copland, Gay Filler, MD  Subjective:   HPI: Patient is a 63 y.o. male here for follow up of chronic intermittent right knee pain.  Patient had steroid injection into right knee on 6/11 with only improvement for 2-3 days. He was referred to physical therapy for trial of McConnell taping on 7/17. He notes he went for a gentle run when he experienced severe medial knee pain with a loud pop that occurred for ~1 sec then went away. He was able to continue his run. Over the weekend this intermittent severe pain occurred several more times. He has also noticed some swelling as well. When he attempts to straighten his leg he gets a "pop" with pain medially and post patellar. No pain currently. He notes he doesn't have continuous pain, only when he bends such as walking up and down stairs.  Really bothers if he tries to bend his knees past 90 degrees.  Of note, patient notes he is a daily runner and he doesn't really have an issue with running. He only has some pain if he increases his speed or if he is running up/down hills. Otherwise he is asymptomatic.  MRI notable for findings consistent arthritis of the lateral aspect of the patellofemoral compartment with prominent edema in the anterior aspect of the lateral femoral condyle with subcortical cyst formation  Review of Systems:  Per HPI.   [place], medications and smoking status reviewed.      Objective:  Physical Exam:  Gen: awake, alert, NAD, comfortable in exam room Pulm: breathing unlabored  Knee, Right: - Inspection: no gross deformity. Moderate effusion. No erythema or bruising. Skin intact - Palpation: TTP along medial joint line - ROM: full active ROM with extension, apprehensive to fully flex due to fear of locking - Strength: 5/5 strength - Neuro/vasc: NV intact - Special Tests: - LIGAMENTS: negative anterior and posterior drawer, negative Lachman's, no MCL or LCL laxity  -- MENISCUS: negative McMurray's, negative Thessaly   -- PF JOINT: nml patellar mobility bilaterally. Positive patellar grind  Hips: normal ROM, negative FABER and FADIR bilaterally   Assessment & Plan:   Acute pain of right knee History and physical exam concerning for medial meniscal damage that may not have shown up on MRI. Discussed management options including aspiration and cortisone injection vs referral to orthopedic surgery vs continued McConnell taping for another 2-3 weeks. Patient opted for referral to ortho surg. Will plan to follow up pending evaluation by ortho. Until then, activity as tolerated and OTC oral/topical analgesics PRN.  Mina Marble, [place], PGY2 09/30/2018 4:51 PM

## 2018-09-30 NOTE — Patient Instructions (Signed)
[facility] [address] [place]   [phone]

## 2018-10-03 ENCOUNTER — Encounter: Payer: Self-pay | Admitting: Family Medicine

## 2018-10-23 ENCOUNTER — Encounter: Payer: Self-pay | Admitting: Family Medicine

## 2018-11-09 NOTE — Progress Notes (Addendum)
[facility] [address] [place], [place] [address] [phone]  Date:  11/11/2018   Name:  Joshua [name]   DOB:  [birth year]   MRN:  [id number]  PCP:  Darreld Mclean, MD    Chief Complaint: Pre-op Exam (neds ekg, and labs prior to knee surgery, right knee)   History of Present Illness:  Joshua [name] is a 63 y.o. very pleasant male patient who presents with the following:  Generally healthy gentleman with history of hypertension, here today for preoperative evaluation He has a planned upcoming knee scope- Dr Latanya Maudlin did an MRI, they plan to check on a couple of areas and may do some intervention. He is a serious runner and can still run ok, but has pain with stairs, etc  His surgeon has requested labs and an EKG prior to procedure Most recent labs in January of this year- will repeat CBC, CMP and A1c today  EKG from 2015- update today  Can give flu shot today Can also have second dose of Shingrix- operation planned for 2.5 weeks from now   He is able to run 10 miles without any CP or SOB No history of heart disease Married to [name], who he met through his running group after his first wife passed away   Patient Active Problem List   Diagnosis Date Noted  . Acute pain of right knee 09/30/2018  . Lumbar back pain 02/18/2018  . Acute non-recurrent frontal sinusitis 04/17/2017  . Chronic neck pain 09/06/2014  . Other and unspecified hyperlipidemia 12/02/2013  . HTN (hypertension) 12/02/2013  . Gout 12/02/2013  . Meralgia paresthetica of right side 12/25/2012  . Seasonal allergic rhinitis 07/20/2012    Past Medical History:  Diagnosis Date  . Allergy   . Arthritis   . Asthma   . Chronic kidney disease   . GERD (gastroesophageal reflux disease)   . Gout   . Hyperlipidemia   . Hypertension   . Kidney stones     Past Surgical History:  Procedure Laterality Date  . kidney stones    . LITHOTRIPSY       Social History   Tobacco Use  . Smoking status: Never Smoker  . Smokeless tobacco: Never Used  Substance Use Topics  . Alcohol use: Yes    Comment: Occasional  . Drug use: No    Family History  Problem Relation Age of Onset  . Hyperlipidemia Mother   . Diabetes Mother   . Pancreatic cancer Mother   . Alcohol abuse Father   . Alcohol abuse Brother   . Drug abuse Brother   . Alcohol abuse Maternal Grandmother   . Hypertension Maternal Grandfather   . Hypertension Paternal Grandfather   . Cancer Sister   . Cancer Paternal Aunt   . Colon cancer Paternal Aunt   . Esophageal cancer Neg Hx   . Liver cancer Neg Hx   . Rectal cancer Neg Hx   . Stomach cancer Neg Hx     Allergies  Allergen Reactions  . Aspirin Anaphylaxis  . Shrimp [Shellfish Allergy] Itching and Other (See Comments)    Almond cherry Shrimp causes stomach upset, tingling of tongue and throat sometimes  . Benadryl [Diphenhydramine Hcl] Rash    Allergic to the topical only  . Other Itching    almonds    Medication list has been reviewed and updated.  Current Outpatient Medications on File Prior to Visit  Medication Sig Dispense Refill  . acetaminophen (TYLENOL) 500 MG tablet Take 500 mg by mouth every 6 (six) hours as needed.    Marland Kitchen allopurinol (ZYLOPRIM) 300 MG tablet Take 1 tablet (300 mg total) by mouth daily. 90 tablet 3  . atorvastatin (LIPITOR) 40 MG tablet TAKE 1 TABLET BY MOUTH EVERY DAY 90 tablet 1  . benazepril (LOTENSIN) 5 MG tablet Take 1/2 tablet (2.63m) by mouth once daily 45 tablet 1  . colchicine 0.6 MG tablet Take 2 pills at first sign of gout, then take 1 pil an hour later 45 tablet 0  . hydrochlorothiazide (HYDRODIURIL) 25 MG tablet TAKE 1 TABLET BY MOUTH EVERY DAY 90 tablet 1  . ibuprofen (ADVIL,MOTRIN) 200 MG tablet Take 600 mg by mouth every 6 (six) hours as needed for pain.     Current Facility-Administered Medications on File Prior to Visit  Medication Dose Route Frequency  Provider Last Rate Last Dose  . 0.9 %  sodium chloride infusion  500 mL Intravenous Once DDoran Stabler MD        Review of Systems:  As per HPI- otherwise negative.   Physical Examination: Vitals:   11/11/18 0842  BP: 128/78  Pulse: (!) 58  Resp: 16  Temp: (!) 97 F (36.1 C)  SpO2: 98%   Vitals:   11/11/18 0842  Weight: 167 lb (75.8 kg)  Height: _0  (1.778 m)   Body mass index is 23.96 kg/m. Ideal Body Weight: Weight in (lb) to have BMI = 25: 173.9  GEN: WDWN, NAD, Non-toxic, A & O x 3, fit build, looks well  HEENT: Atraumatic, Normocephalic. Neck supple. No masses, No LAD. Ears and Nose: No external deformity. CV: RRR, No M/G/R. No JVD. No thrill. No extra heart sounds. PULM: CTA B, no wheezes, crackles, rhonchi. No retractions. No resp. distress. No accessory muscle use. EXTR: No c/c/e NEURO Normal gait.  PSYCH: Normally interactive. Conversant. Not depressed or anxious appearing.  Calm demeanor.   EKG: NSR Compared with 2015 no significant change   Assessment and Plan: Preoperative cardiovascular examination - Plan: CBC, Comprehensive metabolic panel, Hemoglobin A1c, EKG 12-Lead  Essential hypertension - Plan: EKG 12-Lead  Pre-operative cardiovascular examination  Immunization due  Flu and 2nd shingrix today Labs pending EKG completed and benign today  Assuming labs ok will complete and fax in operative clearance form   Signed JLamar Blinks MD  Received his labs, message to pt and completed pre-op form. Gave to BEidson Roadto fax   Results for orders placed or performed in visit on 11/11/18  CBC  Result Value Ref Range   WBC 7.3 4.0 - 10.5 K/uL   RBC 5.11 4.22 - 5.81 Mil/uL   Platelets 194.0 150.0 - 400.0 K/uL   Hemoglobin 15.4 13.0 - 17.0 g/dL   HCT 44.7 39.0 - 52.0 %   MCV 87.5 78.0 - 100.0 fl   MCHC 34.4 30.0 - 36.0 g/dL   RDW 13.4 11.5 - 15.5 %  Comprehensive metabolic panel  Result Value Ref Range   Sodium 138 135 - 145 mEq/L    Potassium 4.4 3.5 - 5.1 mEq/L   Chloride 101 96 - 112 mEq/L   CO2 30 19 - 32 mEq/L   Glucose, Bld 88 70 - 99 mg/dL   BUN 15 6 - 23 mg/dL   Creatinine, Ser 0.92 0.40 - 1.50 mg/dL   Total Bilirubin 0.7 0.2 - 1.2 mg/dL   Alkaline Phosphatase 86 39 - 117 U/L  AST 19 0 - 37 U/L   ALT 18 0 - 53 U/L   Total Protein 6.8 6.0 - 8.3 g/dL   Albumin 4.8 3.5 - 5.2 g/dL   Calcium 9.9 8.4 - 10.5 mg/dL   GFR 83.44 >60.00 mL/min  Hemoglobin A1c  Result Value Ref Range   Hgb A1c MFr Bld 5.8 4.6 - 6.5 %

## 2018-11-09 NOTE — Patient Instructions (Addendum)
It was nice to see you again today, best of luck with your upcoming operation I will be in touch with your labs, assuming no surprises we will clear you for surgery and fax in form!   Flu shot and 2nd shingles given today

## 2018-11-11 ENCOUNTER — Other Ambulatory Visit: Payer: Self-pay

## 2018-11-11 ENCOUNTER — Ambulatory Visit (INDEPENDENT_AMBULATORY_CARE_PROVIDER_SITE_OTHER): Payer: 59 | Admitting: Family Medicine

## 2018-11-11 ENCOUNTER — Encounter: Payer: Self-pay | Admitting: Family Medicine

## 2018-11-11 VITALS — BP 128/78 | HR 58 | Temp 97.0°F | Resp 16 | Ht 70.0 in | Wt 167.0 lb

## 2018-11-11 DIAGNOSIS — Z23 Encounter for immunization: Secondary | ICD-10-CM

## 2018-11-11 DIAGNOSIS — I10 Essential (primary) hypertension: Secondary | ICD-10-CM | POA: Diagnosis not present

## 2018-11-11 DIAGNOSIS — Z01810 Encounter for preprocedural cardiovascular examination: Secondary | ICD-10-CM | POA: Diagnosis not present

## 2018-11-11 LAB — COMPREHENSIVE METABOLIC PANEL
ALT: 18 U/L (ref 0–53)
AST: 19 U/L (ref 0–37)
Albumin: 4.8 g/dL (ref 3.5–5.2)
Alkaline Phosphatase: 86 U/L (ref 39–117)
BUN: 15 mg/dL (ref 6–23)
CO2: 30 mEq/L (ref 19–32)
Calcium: 9.9 mg/dL (ref 8.4–10.5)
Chloride: 101 mEq/L (ref 96–112)
Creatinine, Ser: 0.92 mg/dL (ref 0.40–1.50)
GFR: 83.44 mL/min (ref >60.00)
Glucose, Bld: 88 mg/dL (ref 70–99)
Potassium: 4.4 mEq/L (ref 3.5–5.1)
Sodium: 138 mEq/L (ref 135–145)
Total Bilirubin: 0.7 mg/dL (ref 0.2–1.2)
Total Protein: 6.8 g/dL (ref 6.0–8.3)

## 2018-11-11 LAB — CBC
HCT: 44.7 % (ref 39.0–52.0)
Hemoglobin: 15.4 g/dL (ref 13.0–17.0)
MCHC: 34.4 g/dL (ref 30.0–36.0)
MCV: 87.5 fl (ref 78.0–100.0)
Platelets: 194 10*3/uL (ref 150.0–400.0)
RBC: 5.11 Mil/uL (ref 4.22–5.81)
RDW: 13.4 % (ref 11.5–15.5)
WBC: 7.3 10*3/uL (ref 4.0–10.5)

## 2018-11-11 LAB — HEMOGLOBIN A1C: Hgb A1c MFr Bld: 5.8 % (ref 4.6–6.5)

## 2018-11-11 NOTE — Addendum Note (Signed)
Addended by: Wynonia Musty A on: 11/11/2018 09:29 AM   Modules accepted: Orders

## 2018-11-22 ENCOUNTER — Other Ambulatory Visit: Payer: Self-pay | Admitting: Family Medicine

## 2018-11-22 DIAGNOSIS — I10 Essential (primary) hypertension: Secondary | ICD-10-CM

## 2018-12-04 ENCOUNTER — Telehealth: Payer: Self-pay

## 2018-12-04 DIAGNOSIS — M109 Gout, unspecified: Secondary | ICD-10-CM

## 2018-12-04 NOTE — Telephone Encounter (Signed)
Copied from [place] [id number]. Topic: General - Other >> Dec 04, 2018  4:47 PM Pauline Good wrote: Reason for CRM: pt need prior auth for his allopurinol (ZYLOPRIM) 300 MG tablet.

## 2018-12-05 MED ORDER — ALLOPURINOL 300 MG PO TABS: 300.0000 mg | ORAL_TABLET | Freq: Every day | ORAL | 3 refills | Status: DC

## 2018-12-05 NOTE — Telephone Encounter (Signed)
Spoke w/ Olin Hauser at [employer] at [phone]- per them no PA needed for allopurinol 300mg . Called Pt- he is actually only needing a refill on the allopurinol. Rx sent to [facility] on [address].

## 2018-12-05 NOTE — Telephone Encounter (Signed)
PA initiated via Covermymeds;KEY: [id number]. Per Covermymeds- PA not needed or resolved. See below. I will contact Pt's plan later today for further information.   Your PA has been resolved, no additional PA is required. For further inquiries please contact the number on the back of the member prescription card. (Message [phone])

## 2018-12-24 ENCOUNTER — Other Ambulatory Visit: Payer: Self-pay | Admitting: Family Medicine

## 2018-12-24 DIAGNOSIS — E785 Hyperlipidemia, unspecified: Secondary | ICD-10-CM

## 2019-01-12 ENCOUNTER — Other Ambulatory Visit: Payer: Self-pay | Admitting: Family Medicine

## 2019-01-12 DIAGNOSIS — I10 Essential (primary) hypertension: Secondary | ICD-10-CM

## 2019-04-08 NOTE — Progress Notes (Addendum)
[facility] [address] [place], [place] [address] [phone]  Date:  04/10/2019   Name:  Joshua [name]   DOB:  [birth year]   MRN:  [id number]  PCP:  Darreld Mclean, MD    Chief Complaint: Annual Exam   History of Present Illness:  Joshua [name] is a 64 y.o. very pleasant male patient who presents with the following:  Here today for a routine physical Married to [name], who I saw earlier this week.  He met [name] through his running group after his first wife passed away   History of hypertension, dyslipidemia, gout He rarely has gout sx with his allopurinol [name] enjoys running for exercise, he does quite a bit of mileage each week I last saw him in August, prior to having a knee scope per Dr. Latanya Maudlin He had good results from this operation, but he does have a catch in his knee sometimes He is able to run without any pain, he is training for a big trail race- recently did 18 miles on trails  He is having some difficulty with his back, saw chiro yesterday- he had some improvement after his first treatment  Tetanus up-to-date Colonoscopy up-to-date Flu shot complete, Shingrix done Labs done in August, can update today if desired- he is not quite fasting today  PSA is due Can do HIV screening if patient wishes to do labs today  Lab Results  Component Value Date   PSA 1.18 03/28/2018   PSA 0.85 07/12/2017   PSA 3.43 05/03/2017     Patient Active Problem List   Diagnosis Date Noted  . Acute pain of right knee 09/30/2018  . Lumbar back pain 02/18/2018  . Acute non-recurrent frontal sinusitis 04/17/2017  . Chronic neck pain 09/06/2014  . Other and unspecified hyperlipidemia 12/02/2013  . HTN (hypertension) 12/02/2013  . Gout 12/02/2013  . Meralgia paresthetica of right side 12/25/2012  . Seasonal allergic rhinitis 07/20/2012    Past Medical History:  Diagnosis Date  . Allergy   . Arthritis   .  Asthma   . Chronic kidney disease   . GERD (gastroesophageal reflux disease)   . Gout   . Hyperlipidemia   . Hypertension   . Kidney stones     Past Surgical History:  Procedure Laterality Date  . kidney stones    . LITHOTRIPSY      Social History   Tobacco Use  . Smoking status: Never Smoker  . Smokeless tobacco: Never Used  Substance Use Topics  . Alcohol use: Yes    Comment: Occasional  . Drug use: No    Family History  Problem Relation Age of Onset  . Hyperlipidemia Mother   . Diabetes Mother   . Pancreatic cancer Mother   . Alcohol abuse Father   . Alcohol abuse Brother   . Drug abuse Brother   . Alcohol abuse Maternal Grandmother   . Hypertension Maternal Grandfather   . Hypertension Paternal Grandfather   . Cancer Sister   . Cancer Paternal Aunt   . Colon cancer Paternal Aunt   . Esophageal cancer Neg Hx   . Liver cancer Neg Hx   . Rectal cancer Neg Hx   . Stomach cancer Neg Hx     Allergies  Allergen Reactions  . Aspirin Anaphylaxis  . Shrimp [Shellfish Allergy] Itching and Other (See Comments)    Almond cherry Shrimp causes stomach upset, tingling of tongue and  throat sometimes  . Benadryl [Diphenhydramine Hcl] Rash    Allergic to the topical only  . Other Itching    almonds    Medication list has been reviewed and updated.  Current Outpatient Medications on File Prior to Visit  Medication Sig Dispense Refill  . acetaminophen (TYLENOL) 500 MG tablet Take 500 mg by mouth every 6 (six) hours as needed.    Marland Kitchen allopurinol (ZYLOPRIM) 300 MG tablet Take 1 tablet (300 mg total) by mouth daily. 90 tablet 3  . atorvastatin (LIPITOR) 40 MG tablet TAKE 1 TABLET BY MOUTH EVERY DAY 90 tablet 1  . benazepril (LOTENSIN) 5 MG tablet TAKE 1/2 TABLET (2.'5MG'$ ) BY MOUTH ONCE DAILY 45 tablet 1  . colchicine 0.6 MG tablet Take 2 pills at first sign of gout, then take 1 pil an hour later 45 tablet 0  . hydrochlorothiazide (HYDRODIURIL) 25 MG tablet TAKE 1 TABLET BY  MOUTH EVERY DAY 90 tablet 1  . ibuprofen (ADVIL,MOTRIN) 200 MG tablet Take 600 mg by mouth every 6 (six) hours as needed for pain.     Current Facility-Administered Medications on File Prior to Visit  Medication Dose Route Frequency Provider Last Rate Last Admin  . 0.9 %  sodium chloride infusion  500 mL Intravenous Once Doran Stabler, MD        Review of Systems:  As per HPI- otherwise negative.  No CP, no SOB, no urinary or sexual complaint  Physical Examination: Vitals:   04/10/19 0846  BP: 130/80  Pulse: (!) 57  Resp: 16  Temp: (!) 96.6 F (35.9 C)  SpO2: 97%   Vitals:   04/10/19 0846  Weight: 168 lb (76.2 kg)  Height: '5\' 10"'$  (1.778 m)   Body mass index is 24.11 kg/m. Ideal Body Weight: Weight in (lb) to have BMI = 25: 173.9  GEN: WDWN, NAD, Non-toxic, A & O x 3, fit build, looks well  HEENT: Atraumatic, Normocephalic. Neck supple. No masses, No LAD. Ears and Nose: No external deformity. CV: RRR, No M/G/R. No JVD. No thrill. No extra heart sounds. PULM: CTA B, no wheezes, crackles, rhonchi. No retractions. No resp. distress. No accessory muscle use. ABD: S, NT, ND, +BS. No rebound. No HSM. EXTR: No c/c/e NEURO Normal gait.  PSYCH: Normally interactive. Conversant. Not depressed or anxious appearing.  Calm demeanor.    Assessment and Plan: Physical exam  Hyperlipidemia, unspecified hyperlipidemia type - Plan: atorvastatin (LIPITOR) 40 MG tablet, Lipid panel  Essential hypertension - Plan: benazepril (LOTENSIN) 5 MG tablet, hydrochlorothiazide (HYDRODIURIL) 25 MG tablet  Pre-diabetes - Plan: Comprehensive metabolic panel, Hemoglobin A1c  Idiopathic chronic gout of multiple sites without tophus - Plan: Uric acid  Screening for deficiency anemia - Plan: CBC  Screening for prostate cancer - Plan: PSA  Screening for HIV (human immunodeficiency virus) - Plan: HIV Antibody (routine testing w rflx)  CPE today Generally in excellent health BP well  controlled Will plan further follow- up pending labs.  This visit occurred during the SARS-CoV-2 public health emergency.  Safety protocols were in place, including screening questions prior to the visit, additional usage of staff PPE, and extensive cleaning of exam room while observing appropriate contact time as indicated for disinfecting solutions.   Signed Lamar Blinks, MD  Received his labs as below, message to pt  Results for orders placed or performed in visit on 04/10/19  CBC  Result Value Ref Range   WBC 5.5 4.0 - 10.5 K/uL   RBC 5.10  4.22 - 5.81 Mil/uL   Platelets 202.0 150.0 - 400.0 K/uL   Hemoglobin 15.3 13.0 - 17.0 g/dL   HCT 44.2 39.0 - 52.0 %   MCV 86.7 78.0 - 100.0 fl   MCHC 34.6 30.0 - 36.0 g/dL   RDW 13.5 11.5 - 15.5 %  Comprehensive metabolic panel  Result Value Ref Range   Sodium 137 135 - 145 mEq/L   Potassium 4.3 3.5 - 5.1 mEq/L   Chloride 102 96 - 112 mEq/L   CO2 30 19 - 32 mEq/L   Glucose, Bld 91 70 - 99 mg/dL   BUN 12 6 - 23 mg/dL   Creatinine, Ser 0.95 0.40 - 1.50 mg/dL   Total Bilirubin 0.7 0.2 - 1.2 mg/dL   Alkaline Phosphatase 79 39 - 117 U/L   AST 26 0 - 37 U/L   ALT 22 0 - 53 U/L   Total Protein 6.6 6.0 - 8.3 g/dL   Albumin 4.6 3.5 - 5.2 g/dL   GFR 80.30 >60.00 mL/min   Calcium 9.7 8.4 - 10.5 mg/dL  Hemoglobin A1c  Result Value Ref Range   Hgb A1c MFr Bld 5.6 4.6 - 6.5 %  Lipid panel  Result Value Ref Range   Cholesterol 143 0 - 200 mg/dL   Triglycerides 81.0 0.0 - 149.0 mg/dL   HDL 56.20 >39.00 mg/dL   VLDL 16.2 0.0 - 40.0 mg/dL   LDL Cholesterol 71 0 - 99 mg/dL   Total CHOL/HDL Ratio 3    NonHDL 86.82   PSA  Result Value Ref Range   PSA 1.33 0.10 - 4.00 ng/mL  Uric acid  Result Value Ref Range   Uric Acid, Serum 5.8 4.0 - 7.8 mg/dL   Blood counts normal Metabolic profile normal Z8T shows normal average blood sugar Cholesterol favorable Uric acid in range  Your PSA climbed a bit since last year.  Your absolute number is  still good, but I would like to recheck this in 6 months to watch a bit more closely.  I will order a PSA for you to have done at your convenience as a lab visit only  Lab Results  Component Value Date   PSA 1.33 04/10/2019   PSA 1.18 03/28/2018   PSA 0.85 07/12/2017   Best JC

## 2019-04-08 NOTE — Patient Instructions (Signed)
It was great to see you again today, I will be in touch with your labs as soon as possible    Health Maintenance, Male Adopting a healthy lifestyle and getting preventive care are important in promoting health and wellness. Ask your health care provider about:  The right schedule for you to have regular tests and exams.  Things you can do on your own to prevent diseases and keep yourself healthy. What should I know about diet, weight, and exercise? Eat a healthy diet   Eat a diet that includes plenty of vegetables, fruits, low-fat dairy products, and lean protein.  Do not eat a lot of foods that are high in solid fats, added sugars, or sodium. Maintain a healthy weight Body mass index (BMI) is a measurement that can be used to identify possible weight problems. It estimates body fat based on height and weight. Your health care provider can help determine your BMI and help you achieve or maintain a healthy weight. Get regular exercise Get regular exercise. This is one of the most important things you can do for your health. Most adults should:  Exercise for at least 150 minutes each week. The exercise should increase your heart rate and make you sweat (moderate-intensity exercise).  Do strengthening exercises at least twice a week. This is in addition to the moderate-intensity exercise.  Spend less time sitting. Even light physical activity can be beneficial. Watch cholesterol and blood lipids Have your blood tested for lipids and cholesterol at 63 years of age, then have this test every 5 years. You may need to have your cholesterol levels checked more often if:  Your lipid or cholesterol levels are high.  You are older than 63 years of age.  You are at high risk for heart disease. What should I know about cancer screening? Many types of cancers can be detected early and may often be prevented. Depending on your health history and family history, you may need to have cancer  screening at various ages. This may include screening for:  Colorectal cancer.  Prostate cancer.  Skin cancer.  Lung cancer. What should I know about heart disease, diabetes, and high blood pressure? Blood pressure and heart disease  High blood pressure causes heart disease and increases the risk of stroke. This is more likely to develop in people who have high blood pressure readings, are of African descent, or are overweight.  Talk with your health care provider about your target blood pressure readings.  Have your blood pressure checked: ? Every 3-5 years if you are 74-47 years of age. ? Every year if you are 76 years old or older.  If you are between the ages of 17 and 52 and are a current or former smoker, ask your health care provider if you should have a one-time screening for abdominal aortic aneurysm (AAA). Diabetes Have regular diabetes screenings. This checks your fasting blood sugar level. Have the screening done:  Once every three years after age 18 if you are at a normal weight and have a low risk for diabetes.  More often and at a younger age if you are overweight or have a high risk for diabetes. What should I know about preventing infection? Hepatitis B If you have a higher risk for hepatitis B, you should be screened for this virus. Talk with your health care provider to find out if you are at risk for hepatitis B infection. Hepatitis C Blood testing is recommended for:  Everyone born from  1944 through [birth year].  Anyone with known risk factors for hepatitis C. Sexually transmitted infections (STIs)  You should be screened each year for STIs, including gonorrhea and chlamydia, if: ? You are sexually active and are younger than 63 years of age. ? You are older than 63 years of age and your health care provider tells you that you are at risk for this type of infection. ? Your sexual activity has changed since you were last screened, and you are at increased risk  for chlamydia or gonorrhea. Ask your health care provider if you are at risk.  Ask your health care provider about whether you are at high risk for HIV. Your health care provider may recommend a prescription medicine to help prevent HIV infection. If you choose to take medicine to prevent HIV, you should first get tested for HIV. You should then be tested every 3 months for as long as you are taking the medicine. Follow these instructions at home: Lifestyle  Do not use any products that contain nicotine or tobacco, such as cigarettes, e-cigarettes, and chewing tobacco. If you need help quitting, ask your health care provider.  Do not use street drugs.  Do not share needles.  Ask your health care provider for help if you need support or information about quitting drugs. Alcohol use  Do not drink alcohol if your health care provider tells you not to drink.  If you drink alcohol: ? Limit how much you have to 0-2 drinks a day. ? Be aware of how much alcohol is in your drink. In the [place] one drink equals one 12 oz bottle of beer (355 mL), one 5 oz glass of wine (148 mL), or one 1 oz glass of hard liquor (44 mL). General instructions  Schedule regular health, dental, and eye exams.  Stay current with your vaccines.  Tell your health care provider if: ? You often feel depressed. ? You have ever been abused or do not feel safe at home. Summary  Adopting a healthy lifestyle and getting preventive care are important in promoting health and wellness.  Follow your health care provider's instructions about healthy diet, exercising, and getting tested or screened for diseases.  Follow your health care provider's instructions on monitoring your cholesterol and blood pressure. This information is not intended to replace advice given to you by your health care provider. Make sure you discuss any questions you have with your health care provider. Document Revised: 02/20/2018 Document Reviewed:  02/20/2018 Elsevier Patient Education  [place] to see you again today as always I will be in touch with your labs asap   Health Maintenance, Male Adopting a healthy lifestyle and getting preventive care are important in promoting health and wellness. Ask your health care provider about:  The right schedule for you to have regular tests and exams.  Things you can do on your own to prevent diseases and keep yourself healthy. What should I know about diet, weight, and exercise? Eat a healthy diet   Eat a diet that includes plenty of vegetables, fruits, low-fat dairy products, and lean protein.  Do not eat a lot of foods that are high in solid fats, added sugars, or sodium. Maintain a healthy weight Body mass index (BMI) is a measurement that can be used to identify possible weight problems. It estimates body fat based on height and weight. Your health care provider can help determine your BMI and help you achieve or maintain a healthy weight. Get  regular exercise Get regular exercise. This is one of the most important things you can do for your health. Most adults should:  Exercise for at least 150 minutes each week. The exercise should increase your heart rate and make you sweat (moderate-intensity exercise).  Do strengthening exercises at least twice a week. This is in addition to the moderate-intensity exercise.  Spend less time sitting. Even light physical activity can be beneficial. Watch cholesterol and blood lipids Have your blood tested for lipids and cholesterol at 63 years of age, then have this test every 5 years. You may need to have your cholesterol levels checked more often if:  Your lipid or cholesterol levels are high.  You are older than 63 years of age.  You are at high risk for heart disease. What should I know about cancer screening? Many types of cancers can be detected early and may often be prevented. Depending on your health history and  family history, you may need to have cancer screening at various ages. This may include screening for:  Colorectal cancer.  Prostate cancer.  Skin cancer.  Lung cancer. What should I know about heart disease, diabetes, and high blood pressure? Blood pressure and heart disease  High blood pressure causes heart disease and increases the risk of stroke. This is more likely to develop in people who have high blood pressure readings, are of African descent, or are overweight.  Talk with your health care provider about your target blood pressure readings.  Have your blood pressure checked: ? Every 3-5 years if you are 28-68 years of age. ? Every year if you are 44 years old or older.  If you are between the ages of 18 and 49 and are a current or former smoker, ask your health care provider if you should have a one-time screening for abdominal aortic aneurysm (AAA). Diabetes Have regular diabetes screenings. This checks your fasting blood sugar level. Have the screening done:  Once every three years after age 85 if you are at a normal weight and have a low risk for diabetes.  More often and at a younger age if you are overweight or have a high risk for diabetes. What should I know about preventing infection? Hepatitis B If you have a higher risk for hepatitis B, you should be screened for this virus. Talk with your health care provider to find out if you are at risk for hepatitis B infection. Hepatitis C Blood testing is recommended for:  Everyone born from [birth year] through 1964.  Anyone with known risk factors for hepatitis C. Sexually transmitted infections (STIs)  You should be screened each year for STIs, including gonorrhea and chlamydia, if: ? You are sexually active and are younger than 63 years of age. ? You are older than 63 years of age and your health care provider tells you that you are at risk for this type of infection. ? Your sexual activity has changed since you were  last screened, and you are at increased risk for chlamydia or gonorrhea. Ask your health care provider if you are at risk.  Ask your health care provider about whether you are at high risk for HIV. Your health care provider may recommend a prescription medicine to help prevent HIV infection. If you choose to take medicine to prevent HIV, you should first get tested for HIV. You should then be tested every 3 months for as long as you are taking the medicine. Follow these instructions at home: Lifestyle  Do not use any products that contain nicotine or tobacco, such as cigarettes, e-cigarettes, and chewing tobacco. If you need help quitting, ask your health care provider.  Do not use street drugs.  Do not share needles.  Ask your health care provider for help if you need support or information about quitting drugs. Alcohol use  Do not drink alcohol if your health care provider tells you not to drink.  If you drink alcohol: ? Limit how much you have to 0-2 drinks a day. ? Be aware of how much alcohol is in your drink. In the [place] one drink equals one 12 oz bottle of beer (355 mL), one 5 oz glass of wine (148 mL), or one 1 oz glass of hard liquor (44 mL). General instructions  Schedule regular health, dental, and eye exams.  Stay current with your vaccines.  Tell your health care provider if: ? You often feel depressed. ? You have ever been abused or do not feel safe at home. Summary  Adopting a healthy lifestyle and getting preventive care are important in promoting health and wellness.  Follow your health care provider's instructions about healthy diet, exercising, and getting tested or screened for diseases.  Follow your health care provider's instructions on monitoring your cholesterol and blood pressure. This information is not intended to replace advice given to you by your health care provider. Make sure you discuss any questions you have with your health care  provider. Document Revised: 02/20/2018 Document Reviewed: 02/20/2018 Elsevier Patient Education  2020 [employer].

## 2019-04-10 ENCOUNTER — Other Ambulatory Visit: Payer: Self-pay

## 2019-04-10 ENCOUNTER — Encounter: Payer: Self-pay | Admitting: Family Medicine

## 2019-04-10 ENCOUNTER — Ambulatory Visit (INDEPENDENT_AMBULATORY_CARE_PROVIDER_SITE_OTHER): Payer: 59 | Admitting: Family Medicine

## 2019-04-10 VITALS — BP 130/80 | HR 57 | Temp 96.6°F | Resp 16 | Ht 70.0 in | Wt 168.0 lb

## 2019-04-10 DIAGNOSIS — Z130 Encounter for screening for diseases of the blood and blood-forming organs and certain disorders involving the immune mechanism: Secondary | ICD-10-CM | POA: Diagnosis not present

## 2019-04-10 DIAGNOSIS — Z0000 Encounter for general adult medical examination without abnormal findings: Secondary | ICD-10-CM | POA: Diagnosis not present

## 2019-04-10 DIAGNOSIS — E785 Hyperlipidemia, unspecified: Secondary | ICD-10-CM

## 2019-04-10 DIAGNOSIS — M1A09X0 Idiopathic chronic gout, multiple sites, without tophus (tophi): Secondary | ICD-10-CM

## 2019-04-10 DIAGNOSIS — I10 Essential (primary) hypertension: Secondary | ICD-10-CM | POA: Diagnosis not present

## 2019-04-10 DIAGNOSIS — Z114 Encounter for screening for human immunodeficiency virus [HIV]: Secondary | ICD-10-CM

## 2019-04-10 DIAGNOSIS — R7303 Prediabetes: Secondary | ICD-10-CM | POA: Diagnosis not present

## 2019-04-10 DIAGNOSIS — Z125 Encounter for screening for malignant neoplasm of prostate: Secondary | ICD-10-CM | POA: Diagnosis not present

## 2019-04-10 LAB — COMPREHENSIVE METABOLIC PANEL
ALT: 22 U/L (ref 0–53)
AST: 26 U/L (ref 0–37)
Albumin: 4.6 g/dL (ref 3.5–5.2)
Alkaline Phosphatase: 79 U/L (ref 39–117)
BUN: 12 mg/dL (ref 6–23)
CO2: 30 mEq/L (ref 19–32)
Calcium: 9.7 mg/dL (ref 8.4–10.5)
Chloride: 102 mEq/L (ref 96–112)
Creatinine, Ser: 0.95 mg/dL (ref 0.40–1.50)
GFR: 80.3 mL/min (ref >60.00)
Glucose, Bld: 91 mg/dL (ref 70–99)
Potassium: 4.3 mEq/L (ref 3.5–5.1)
Sodium: 137 mEq/L (ref 135–145)
Total Bilirubin: 0.7 mg/dL (ref 0.2–1.2)
Total Protein: 6.6 g/dL (ref 6.0–8.3)

## 2019-04-10 LAB — CBC
HCT: 44.2 % (ref 39.0–52.0)
Hemoglobin: 15.3 g/dL (ref 13.0–17.0)
MCHC: 34.6 g/dL (ref 30.0–36.0)
MCV: 86.7 fl (ref 78.0–100.0)
Platelets: 202 10*3/uL (ref 150.0–400.0)
RBC: 5.1 Mil/uL (ref 4.22–5.81)
RDW: 13.5 % (ref 11.5–15.5)
WBC: 5.5 10*3/uL (ref 4.0–10.5)

## 2019-04-10 LAB — LIPID PANEL
Cholesterol: 143 mg/dL (ref 0–200)
HDL: 56.2 mg/dL (ref >39.00)
LDL Cholesterol: 71 mg/dL (ref 0–99)
NonHDL: 86.82
Total CHOL/HDL Ratio: 3
Triglycerides: 81 mg/dL (ref 0.0–149.0)
VLDL: 16.2 mg/dL (ref 0.0–40.0)

## 2019-04-10 LAB — URIC ACID: Uric Acid, Serum: 5.8 mg/dL (ref 4.0–7.8)

## 2019-04-10 LAB — HEMOGLOBIN A1C: Hgb A1c MFr Bld: 5.6 % (ref 4.6–6.5)

## 2019-04-10 LAB — PSA: PSA: 1.33 ng/mL (ref 0.10–4.00)

## 2019-04-10 MED ORDER — ATORVASTATIN CALCIUM 40 MG PO TABS: 40.0000 mg | ORAL_TABLET | Freq: Every day | ORAL | 3 refills | Status: DC

## 2019-04-10 MED ORDER — HYDROCHLOROTHIAZIDE 25 MG PO TABS: 25.0000 mg | ORAL_TABLET | Freq: Every day | ORAL | 3 refills | Status: DC

## 2019-04-10 MED ORDER — BENAZEPRIL HCL 5 MG PO TABS: ORAL_TABLET | ORAL | 3 refills | Status: DC

## 2019-04-10 NOTE — Addendum Note (Signed)
Addended by: Lamar Blinks C on: 04/10/2019 03:14 PM   Modules accepted: Orders

## 2019-04-11 LAB — HIV ANTIBODY (ROUTINE TESTING W REFLEX): HIV 1&2 Ab, 4th Generation: NONREACTIVE

## 2019-07-09 IMAGING — MR MRI OF THE RIGHT KNEE WITHOUT CONTRAST
4 of 6 series · 19 of 40 positions shown · non-contrast
Comparison: Radiographs dated 07/11/2018

CLINICAL DATA: Right knee pain with instability and painful range
of motion.

EXAM:
MRI OF THE RIGHT KNEE WITHOUT CONTRAST
TECHNIQUE: Multiplanar, multisequence MR imaging of the knee was performed. No
intravenous contrast was administered.

[Series 3: T2 fat-sat · axial · 4.0mm · 0.31mm/px · z∈[-43,+36]mm · 3 of 28 slices shown (1 of 2)]
[im 5/28]
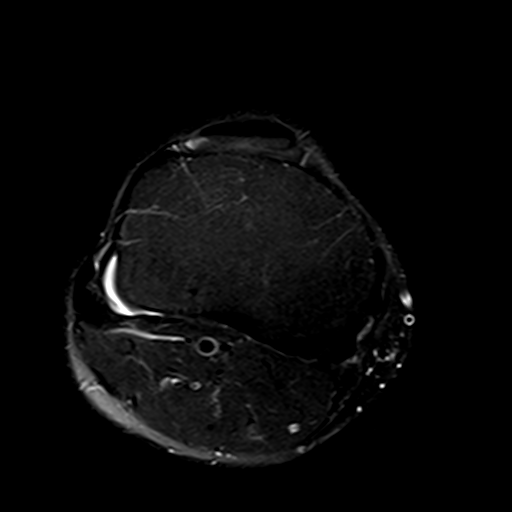
[im 14/28]
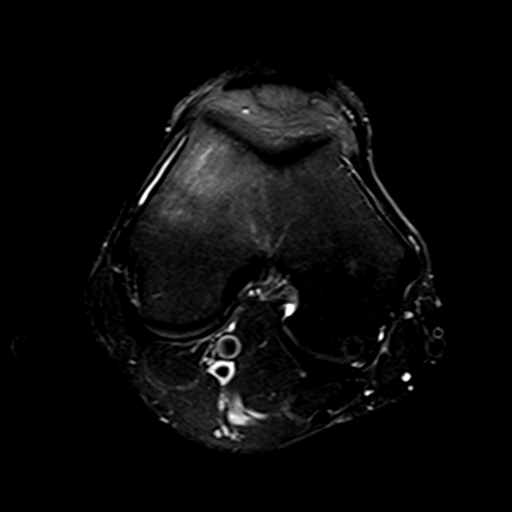
[im 23/28]
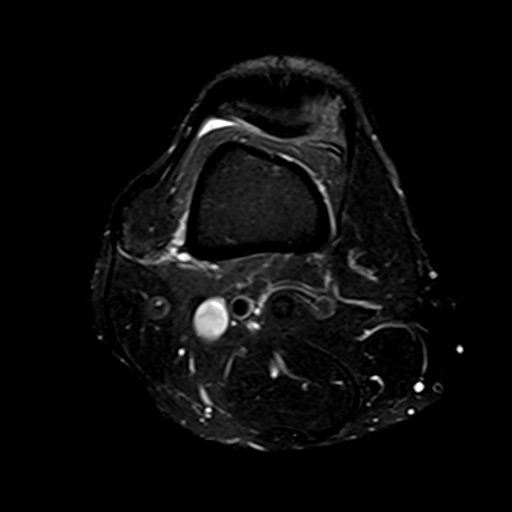

[Series 5: T2 fat-sat · coronal · 4.0mm · 0.29mm/px · 3 of 24 slices shown (2 of 2)]
[im 5/24]
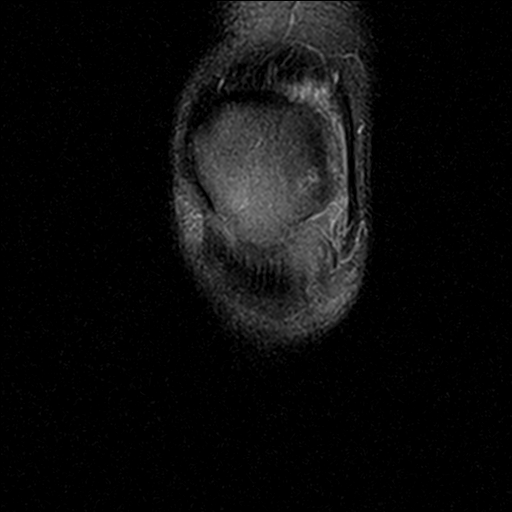
[im 14/24]
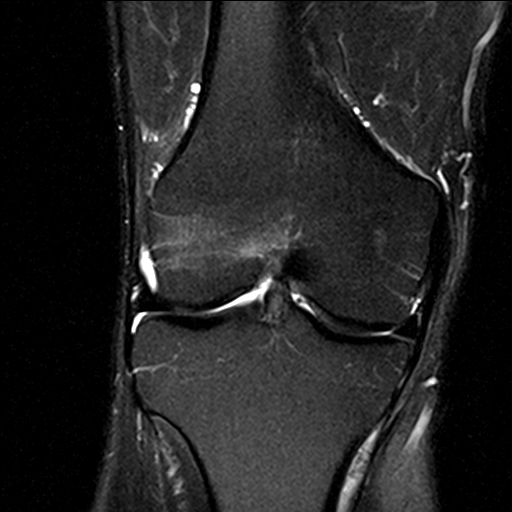
[im 24/24]
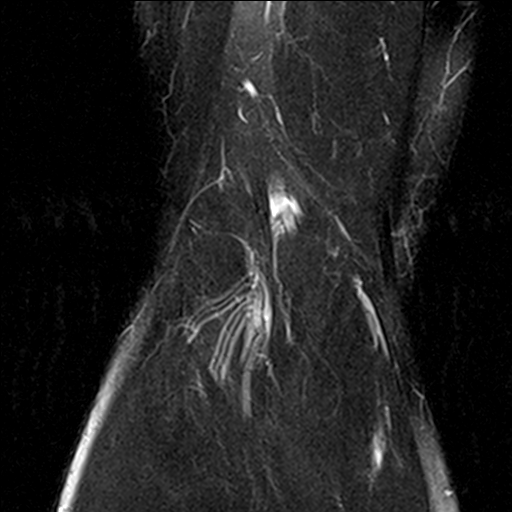

[Series 6: PD fat-sat · coronal · 3.0mm · 0.29mm/px · 7 of 28 slices shown (1 of 2)]
[im 1/28]
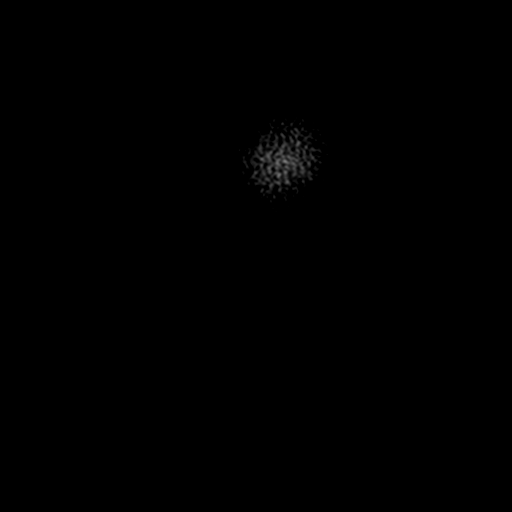
[im 5/28]
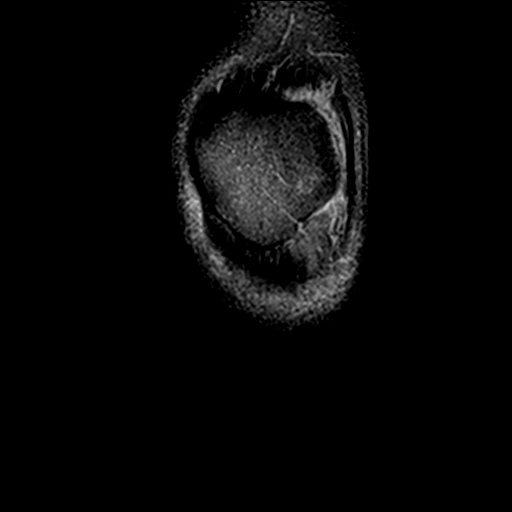
[im 10/28]
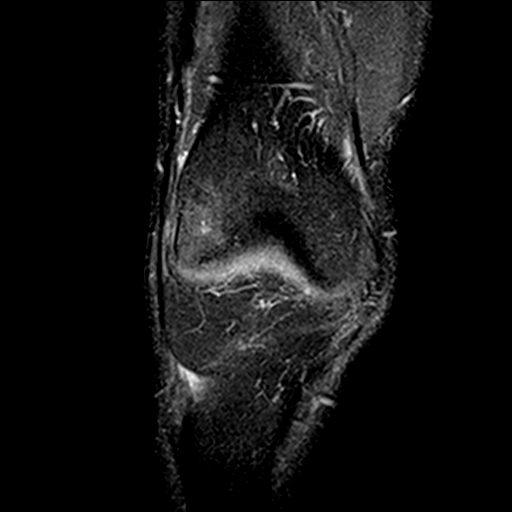
[im 14/28]
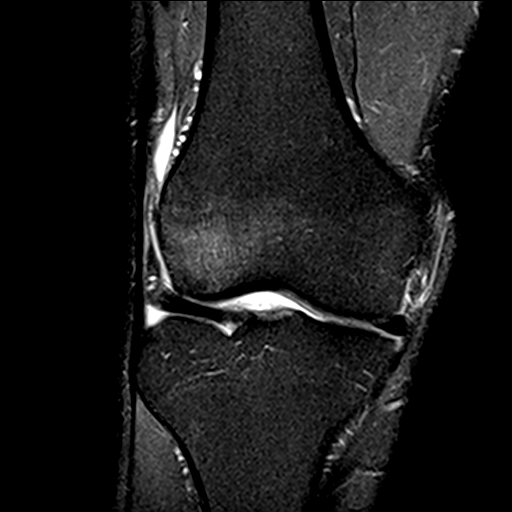
[im 19/28]
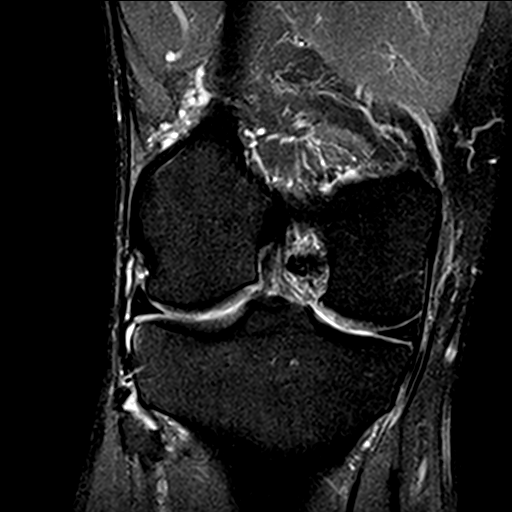
[im 23/28]
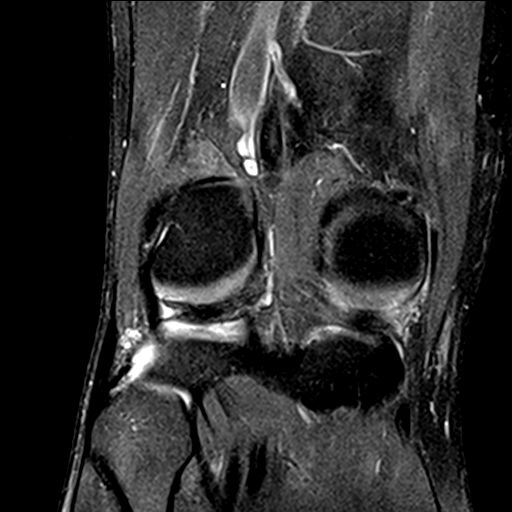
[im 28/28]
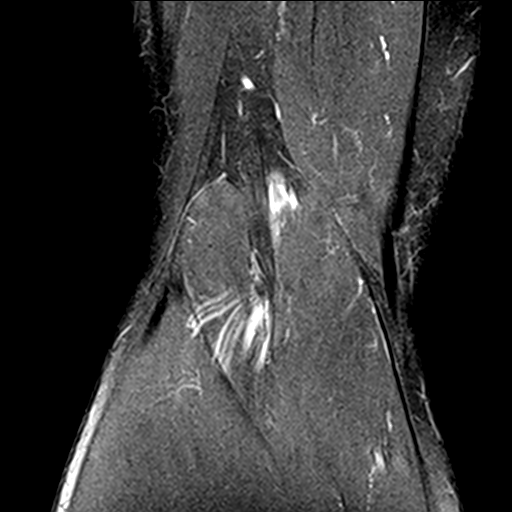

[Series 7: PD fat-sat · sagittal · 3.0mm · 0.29mm/px · 6 of 27 slices shown (2 of 2)]
[im 1/27]
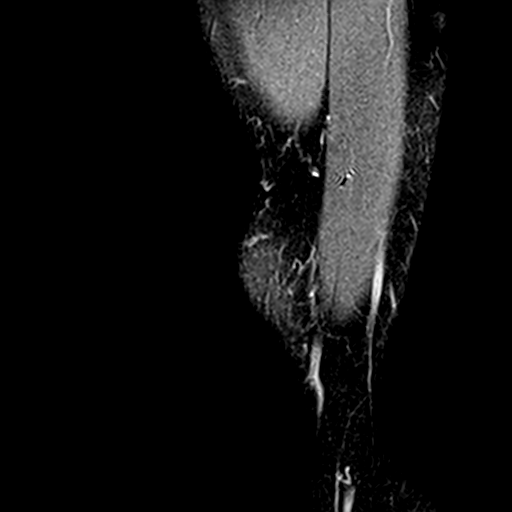
[im 5/27]
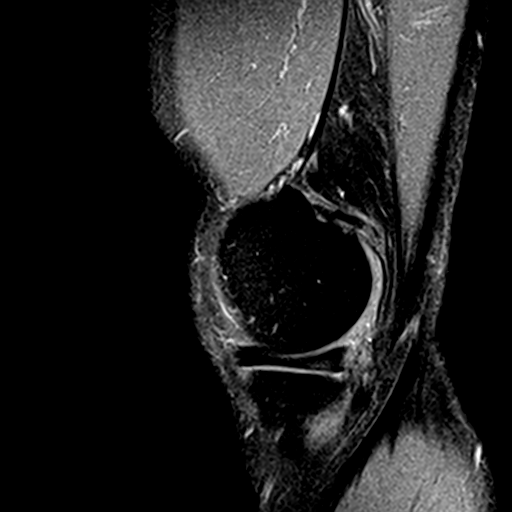
[im 9/27]
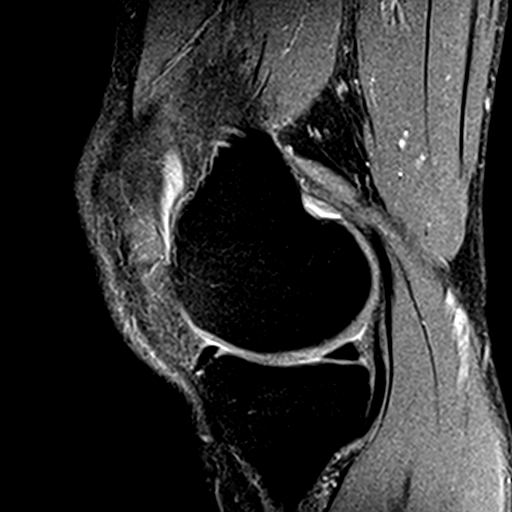
[im 14/27]
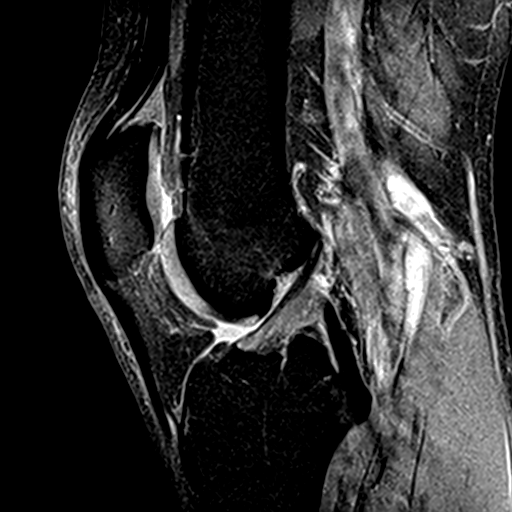
[im 18/27]
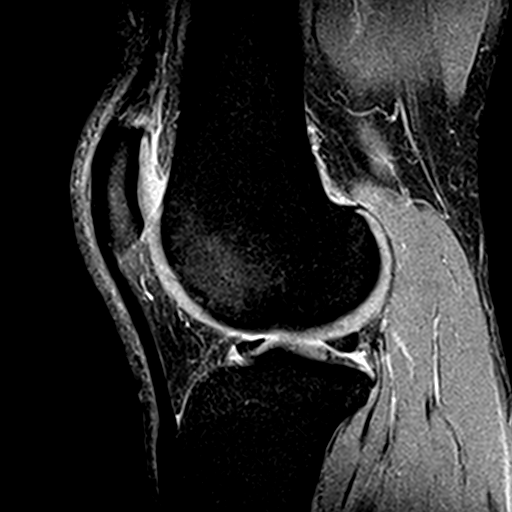
[im 22/27]
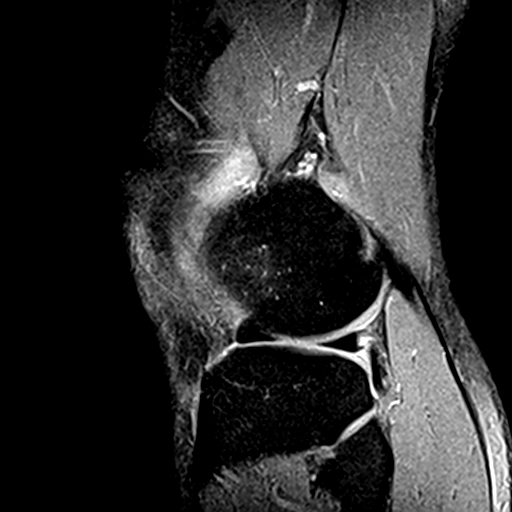

[19 of 40 positions shown; findings below may reference images not displayed]

FINDINGS: MENISCI

Medial meniscus:  Normal.

Lateral meniscus:  Normal.

LIGAMENTS

Cruciates:  Normal.

Collaterals:  Normal.

CARTILAGE

Patellofemoral: There is subcortical edema as well as multiple tiny
subcortical cysts in the anterior aspect of the lateral femoral
condyle. Overlying articular cartilage appears to be intact. I
suspect there are tiny defects in the cartilage at that site. There
are tiny fissures in cartilage of the lateral facet of the patella.

Medial:  Normal.

Lateral:  Normal.

Joint: Trace joint effusion. Normal Hoffa's fat pad. No plical
thickening.

Popliteal Fossa:  No Baker cyst. Intact popliteus tendon.

Extensor Mechanism:  Intact quadriceps tendon and patellar tendon.

Bones: Abnormal edema and subcortical cyst formation in the anterior
aspect of the lateral femoral condyle. The bones are otherwise
normal.

Other: None
IMPRESSION: Findings consistent arthritis of the lateral aspect of the
patellofemoral compartment with prominent edema in the anterior
aspect of the lateral femoral condyle with subcortical cyst
formation. No discrete defects in the overlying articular cartilage
although I suspect there are tiny defects creating the underlying
abnormalities.

Tiny fissures in the articular cartilage of the lateral facet of the
patella.

## 2019-12-05 ENCOUNTER — Other Ambulatory Visit: Payer: Self-pay | Admitting: Family Medicine

## 2019-12-05 DIAGNOSIS — M109 Gout, unspecified: Secondary | ICD-10-CM

## 2020-04-20 NOTE — Progress Notes (Addendum)
[facility] [address] [place], [place] [address] [phone]  Date:  04/22/2020   Name:  Joshua [name]   DOB:  1955/03/22   MRN:  [id number]  PCP:  Darreld Mclean, MD    Chief Complaint: Back Pain (Back stiffness, couple of years various treatments, no known injury) and Throat Narrowing (Feel liks throat is closing when trying to sleep, related to adenoids? Feels like he has to "sniffle")   History of Present Illness:  Joshua [name] is a 65 y.o. very pleasant male patient who presents with the following:  Pt here today for follow-up Last seen by myself about one year ago   Married to [name]-  He met [name] through his running group after his first wife passed away  Their first grandchild, a boy, was born about a year ago History of hypertension, dyslipidemia, gout He rarely has gout sx with his allopurinol  shingrix done Flu done covid - done including booster  Most recent labs one year ago  Colon UTD   He notes that his back seems to be getting worse-  He had a lumbar MRI in 2014 and 2020 He feels stiff and has a hard time getting up from seated position On the other hand, he is still fit and just did a 20 mile trail race  His most recent MRI 2020 IMPRESSION: Dominant abnormality is at L5-S1 where central protrusion extends to the RIGHT foramen. RIGHT L5 neural impingement is likely. This has progressed from prior MR. Foraminal protrusion at L2-3 on the RIGHT, not clearly compressive. Central protrusion at L4-5 with mild stenosis, does not appear sufficiently severe to result in BILATERAL lower extremity symptoms.  No bowel or bladder control loss He may have a little numbness in his anterior right leg- not as bad as it was in 2014 No leg weakness noted He did do an epidural steroid injection in 2014 and had a very good result from it-he noted significant improvement which seem to last for quite a  long time He would be interested in doing this again  He will feel like there is mucus in the back of his throat He may feel like he has to clear his throat  At night - esp if sleeping on his back- he will be woken up from sleep feeling like he is choking.  He is using braces right now and this seems to be making him worse He is snoring like normal Most night he feels like he rests ok   Never had a tonsillectomy   No recent gout sx   BP Readings from Last 3 Encounters:  04/22/20 (!) 142/84  04/10/19 130/80  11/11/18 128/78      Patient Active Problem List   Diagnosis Date Noted   Acute pain of right knee 09/30/2018   Lumbar back pain 02/18/2018   Acute non-recurrent frontal sinusitis 04/17/2017   Chronic neck pain 09/06/2014   Other and unspecified hyperlipidemia 12/02/2013   HTN (hypertension) 12/02/2013   Gout 12/02/2013   Meralgia paresthetica of right side 12/25/2012   Seasonal allergic rhinitis 07/20/2012    Past Medical History:  Diagnosis Date   Allergy    Arthritis    Asthma    Chronic kidney disease    GERD (gastroesophageal reflux disease)    Gout    Hyperlipidemia    Hypertension    Kidney stones     Past Surgical History:  Procedure Laterality Date   kidney stones     LITHOTRIPSY      Social History   Tobacco Use   Smoking status: Never Smoker   Smokeless tobacco: Never Used  Substance Use Topics   Alcohol use: Yes    Comment: Occasional   Drug use: No    Family History  Problem Relation Age of Onset   Hyperlipidemia Mother    Diabetes Mother    Pancreatic cancer Mother    Alcohol abuse Father    Alcohol abuse Brother    Drug abuse Brother    Alcohol abuse Maternal Grandmother    Hypertension Maternal Grandfather    Hypertension Paternal Grandfather    Cancer Sister    Cancer Paternal Aunt    Colon cancer Paternal Aunt    Esophageal cancer Neg Hx    Liver cancer Neg Hx    Rectal cancer  Neg Hx    Stomach cancer Neg Hx     Allergies  Allergen Reactions   Aspirin Anaphylaxis   Shrimp [Shellfish Allergy] Itching and Other (See Comments)    Almond cherry Shrimp causes stomach upset, tingling of tongue and throat sometimes   Benadryl [Diphenhydramine Hcl] Rash    Allergic to the topical only   Other Itching    almonds    Medication list has been reviewed and updated.  Current Outpatient Medications on File Prior to Visit  Medication Sig Dispense Refill   acetaminophen (TYLENOL) 500 MG tablet Take 500 mg by mouth every 6 (six) hours as needed.     allopurinol (ZYLOPRIM) 300 MG tablet TAKE 1 TABLET BY MOUTH EVERY DAY 90 tablet 3   atorvastatin (LIPITOR) 40 MG tablet Take 1 tablet (40 mg total) by mouth daily. 90 tablet 3   benazepril (LOTENSIN) 5 MG tablet Take 1/2 tablet daily 45 tablet 3   colchicine 0.6 MG tablet Take 2 pills at first sign of gout, then take 1 pil an hour later 45 tablet 0   hydrochlorothiazide (HYDRODIURIL) 25 MG tablet Take 1 tablet (25 mg total) by mouth daily. 90 tablet 3   ibuprofen (ADVIL,MOTRIN) 200 MG tablet Take 600 mg by mouth every 6 (six) hours as needed for pain.     Current Facility-Administered Medications on File Prior to Visit  Medication Dose Route Frequency Provider Last Rate Last Admin   0.9 %  sodium chloride infusion  500 mL Intravenous Once Danis, Estill Cotta III, MD        Review of Systems:  As per HPI- otherwise negative.   Physical Examination: Vitals:   04/22/20 1316  BP: (!) 142/84  Pulse: 68  Resp: 16  SpO2: 97%   Vitals:   04/22/20 1316  Weight: 172 lb (78 kg)  Height: '5\' 10"'  (1.778 m)   Body mass index is 24.68 kg/m. Ideal Body Weight: Weight in (lb) to have BMI = 25: 173.9  GEN: no acute distress.  Normal weight, looks well and very fit for age HEENT: Atraumatic, Normocephalic.  Ears and Nose: No external deformity. CV: RRR, No M/G/R. No JVD. No thrill. No extra heart sounds. PULM: CTA  B, no wheezes, crackles, rhonchi. No retractions. No resp. distress. No accessory muscle use. ABD: S, NT, ND, +BS. No rebound. No HSM. EXTR: No c/c/e PSYCH: Normally interactive. Conversant.  He does have visible tonsils bilaterally, reasonable posterior oropharynx space. He has tight hamstrings, he notes this is his baseline-normal thoracolumbar range of motion He has mild tenderness to palpation over the  right-sided sacroiliac joint. Bilateral lower extremity strength, sensation, DTR.  Negative straight leg raise   Assessment and Plan: Lumbar back pain - Plan: Ambulatory referral to Interventional Radiology  Essential hypertension - Plan: CBC, Comprehensive metabolic panel  Hyperlipidemia, unspecified hyperlipidemia type - Plan: Lipid panel  Idiopathic chronic gout of multiple sites without tophus - Plan: Uric acid  Screening for deficiency anemia - Plan: CBC  Screening for prostate cancer - Plan: PSA  Pre-diabetes - Plan: Hemoglobin A1c  Snoring - Plan: Ambulatory referral to Neurology  Patient today with a couple of concerns.  Routine labs are pending as above He notes some snoring and congestion in the back of his throat, worse when he is asleep.  He may have sleep apnea despite thin body habitus. He does notice some nasal congestion, I suggest that he try nasal steroid spray in the meantime Recurrent back pain, patient has known history of a bulging lumbar disc.  He did an epidural steroid injection about 7 years ago with good results.  He would like to try this again if possible.  I will refer him back to interventional radiology for consultation  This visit occurred during the SARS-CoV-2 public health emergency.  Safety protocols were in place, including screening questions prior to the visit, additional usage of staff PPE, and extensive cleaning of exam room while observing appropriate contact time as indicated for disinfecting solutions.    Signed Lamar Blinks,  MD  Received his labs as below 2/11- message to pt  Results for orders placed or performed in visit on 04/22/20  CBC  Result Value Ref Range   WBC 7.9 4.0 - 10.5 K/uL   RBC 4.82 4.22 - 5.81 Mil/uL   Platelets 206.0 150.0 - 400.0 K/uL   Hemoglobin 14.4 13.0 - 17.0 g/dL   HCT 41.9 39.0 - 52.0 %   MCV 87.1 78.0 - 100.0 fl   MCHC 34.3 30.0 - 36.0 g/dL   RDW 13.6 11.5 - 15.5 %  Comprehensive metabolic panel  Result Value Ref Range   Sodium 137 135 - 145 mEq/L   Potassium 4.2 3.5 - 5.1 mEq/L   Chloride 99 96 - 112 mEq/L   CO2 29 19 - 32 mEq/L   Glucose, Bld 83 70 - 99 mg/dL   BUN 13 6 - 23 mg/dL   Creatinine, Ser 0.91 0.40 - 1.50 mg/dL   Total Bilirubin 0.7 0.2 - 1.2 mg/dL   Alkaline Phosphatase 86 39 - 117 U/L   AST 22 0 - 37 U/L   ALT 21 0 - 53 U/L   Total Protein 7.0 6.0 - 8.3 g/dL   Albumin 4.7 3.5 - 5.2 g/dL   GFR 89.91 >60.00 mL/min   Calcium 9.9 8.4 - 10.5 mg/dL  Hemoglobin A1c  Result Value Ref Range   Hgb A1c MFr Bld 5.7 4.6 - 6.5 %  Lipid panel  Result Value Ref Range   Cholesterol 141 0 - 200 mg/dL   Triglycerides 218.0 (H) 0.0 - 149.0 mg/dL   HDL 51.60 >39.00 mg/dL   VLDL 43.6 (H) 0.0 - 40.0 mg/dL   Total CHOL/HDL Ratio 3    NonHDL 89.03   PSA  Result Value Ref Range   PSA 1.06 0.10 - 4.00 ng/mL  Uric acid  Result Value Ref Range   Uric Acid, Serum 4.7 4.0 - 7.8 mg/dL  LDL cholesterol, direct  Result Value Ref Range   Direct LDL 70.0 mg/dL

## 2020-04-20 NOTE — Patient Instructions (Incomplete)
It was good to see you again today!  I will be in touch with your labs soon as possible Referral made to Interventional Radiology for a consultation regarding repeat epidural steroid injection. Please let me know if you don't hear anything about an appt   I also made a referral for a sleep study- in the meantime try a nasal steroid spray such as Flonase or Nasacort OTC; this may be helpful    Health Maintenance, Male Adopting a healthy lifestyle and getting preventive care are important in promoting health and wellness. Ask your health care provider about:  The right schedule for you to have regular tests and exams.  Things you can do on your own to prevent diseases and keep yourself healthy. What should I know about diet, weight, and exercise? Eat a healthy diet  Eat a diet that includes plenty of vegetables, fruits, low-fat dairy products, and lean protein.  Do not eat a lot of foods that are high in solid fats, added sugars, or sodium.   Maintain a healthy weight Body mass index (BMI) is a measurement that can be used to identify possible weight problems. It estimates body fat based on height and weight. Your health care provider can help determine your BMI and help you achieve or maintain a healthy weight. Get regular exercise Get regular exercise. This is one of the most important things you can do for your health. Most adults should:  Exercise for at least 150 minutes each week. The exercise should increase your heart rate and make you sweat (moderate-intensity exercise).  Do strengthening exercises at least twice a week. This is in addition to the moderate-intensity exercise.  Spend less time sitting. Even light physical activity can be beneficial. Watch cholesterol and blood lipids Have your blood tested for lipids and cholesterol at 64 years of age, then have this test every 5 years. You may need to have your cholesterol levels checked more often if:  Your lipid or  cholesterol levels are high.  You are older than 64 years of age.  You are at high risk for heart disease. What should I know about cancer screening? Many types of cancers can be detected early and may often be prevented. Depending on your health history and family history, you may need to have cancer screening at various ages. This may include screening for:  Colorectal cancer.  Prostate cancer.  Skin cancer.  Lung cancer. What should I know about heart disease, diabetes, and high blood pressure? Blood pressure and heart disease  High blood pressure causes heart disease and increases the risk of stroke. This is more likely to develop in people who have high blood pressure readings, are of African descent, or are overweight.  Talk with your health care provider about your target blood pressure readings.  Have your blood pressure checked: ? Every 3-5 years if you are 13-5 years of age. ? Every year if you are 31 years old or older.  If you are between the ages of 71 and 76 and are a current or former smoker, ask your health care provider if you should have a one-time screening for abdominal aortic aneurysm (AAA). Diabetes Have regular diabetes screenings. This checks your fasting blood sugar level. Have the screening done:  Once every three years after age 33 if you are at a normal weight and have a low risk for diabetes.  More often and at a younger age if you are overweight or have a high risk for  diabetes. What should I know about preventing infection? Hepatitis B If you have a higher risk for hepatitis B, you should be screened for this virus. Talk with your health care provider to find out if you are at risk for hepatitis B infection. Hepatitis C Blood testing is recommended for:  Everyone born from [birth year] through 1964.  Anyone with known risk factors for hepatitis C. Sexually transmitted infections (STIs)  You should be screened each year for STIs, including gonorrhea  and chlamydia, if: ? You are sexually active and are younger than 64 years of age. ? You are older than 64 years of age and your health care provider tells you that you are at risk for this type of infection. ? Your sexual activity has changed since you were last screened, and you are at increased risk for chlamydia or gonorrhea. Ask your health care provider if you are at risk.  Ask your health care provider about whether you are at high risk for HIV. Your health care provider may recommend a prescription medicine to help prevent HIV infection. If you choose to take medicine to prevent HIV, you should first get tested for HIV. You should then be tested every 3 months for as long as you are taking the medicine. Follow these instructions at home: Lifestyle  Do not use any products that contain nicotine or tobacco, such as cigarettes, e-cigarettes, and chewing tobacco. If you need help quitting, ask your health care provider.  Do not use street drugs.  Do not share needles.  Ask your health care provider for help if you need support or information about quitting drugs. Alcohol use  Do not drink alcohol if your health care provider tells you not to drink.  If you drink alcohol: ? Limit how much you have to 0-2 drinks a day. ? Be aware of how much alcohol is in your drink. In the [place] one drink equals one 12 oz bottle of beer (355 mL), one 5 oz glass of wine (148 mL), or one 1 oz glass of hard liquor (44 mL). General instructions  Schedule regular health, dental, and eye exams.  Stay current with your vaccines.  Tell your health care provider if: ? You often feel depressed. ? You have ever been abused or do not feel safe at home. Summary  Adopting a healthy lifestyle and getting preventive care are important in promoting health and wellness.  Follow your health care provider's instructions about healthy diet, exercising, and getting tested or screened for diseases.  Follow your  health care provider's instructions on monitoring your cholesterol and blood pressure. This information is not intended to replace advice given to you by your health care provider. Make sure you discuss any questions you have with your health care provider. Document Revised: 02/20/2018 Document Reviewed: 02/20/2018 Elsevier Patient Education  2021 [employer].

## 2020-04-22 ENCOUNTER — Ambulatory Visit (INDEPENDENT_AMBULATORY_CARE_PROVIDER_SITE_OTHER): Payer: 59 | Admitting: Family Medicine

## 2020-04-22 ENCOUNTER — Other Ambulatory Visit: Payer: Self-pay

## 2020-04-22 ENCOUNTER — Encounter: Payer: Self-pay | Admitting: Family Medicine

## 2020-04-22 VITALS — BP 142/84 | HR 68 | Resp 16 | Ht 70.0 in | Wt 172.0 lb

## 2020-04-22 DIAGNOSIS — M1A09X0 Idiopathic chronic gout, multiple sites, without tophus (tophi): Secondary | ICD-10-CM

## 2020-04-22 DIAGNOSIS — Z125 Encounter for screening for malignant neoplasm of prostate: Secondary | ICD-10-CM

## 2020-04-22 DIAGNOSIS — R0683 Snoring: Secondary | ICD-10-CM

## 2020-04-22 DIAGNOSIS — Z130 Encounter for screening for diseases of the blood and blood-forming organs and certain disorders involving the immune mechanism: Secondary | ICD-10-CM

## 2020-04-22 DIAGNOSIS — M545 Low back pain, unspecified: Secondary | ICD-10-CM

## 2020-04-22 DIAGNOSIS — R7303 Prediabetes: Secondary | ICD-10-CM

## 2020-04-22 DIAGNOSIS — E785 Hyperlipidemia, unspecified: Secondary | ICD-10-CM | POA: Diagnosis not present

## 2020-04-22 DIAGNOSIS — I10 Essential (primary) hypertension: Secondary | ICD-10-CM | POA: Diagnosis not present

## 2020-04-23 ENCOUNTER — Other Ambulatory Visit: Payer: Self-pay | Admitting: Family Medicine

## 2020-04-23 DIAGNOSIS — I10 Essential (primary) hypertension: Secondary | ICD-10-CM

## 2020-04-23 LAB — LIPID PANEL
Cholesterol: 141 mg/dL (ref 0–200)
HDL: 51.6 mg/dL (ref >39.00)
NonHDL: 89.03
Total CHOL/HDL Ratio: 3
Triglycerides: 218 mg/dL — ABNORMAL HIGH (ref 0.0–149.0)
VLDL: 43.6 mg/dL — ABNORMAL HIGH (ref 0.0–40.0)

## 2020-04-23 LAB — LDL CHOLESTEROL, DIRECT: Direct LDL: 70 mg/dL

## 2020-04-23 LAB — CBC
HCT: 41.9 % (ref 39.0–52.0)
Hemoglobin: 14.4 g/dL (ref 13.0–17.0)
MCHC: 34.3 g/dL (ref 30.0–36.0)
MCV: 87.1 fl (ref 78.0–100.0)
Platelets: 206 10*3/uL (ref 150.0–400.0)
RBC: 4.82 Mil/uL (ref 4.22–5.81)
RDW: 13.6 % (ref 11.5–15.5)
WBC: 7.9 10*3/uL (ref 4.0–10.5)

## 2020-04-23 LAB — COMPREHENSIVE METABOLIC PANEL
ALT: 21 U/L (ref 0–53)
AST: 22 U/L (ref 0–37)
Albumin: 4.7 g/dL (ref 3.5–5.2)
Alkaline Phosphatase: 86 U/L (ref 39–117)
BUN: 13 mg/dL (ref 6–23)
CO2: 29 mEq/L (ref 19–32)
Calcium: 9.9 mg/dL (ref 8.4–10.5)
Chloride: 99 mEq/L (ref 96–112)
Creatinine, Ser: 0.91 mg/dL (ref 0.40–1.50)
GFR: 89.91 mL/min (ref >60.00)
Glucose, Bld: 83 mg/dL (ref 70–99)
Potassium: 4.2 mEq/L (ref 3.5–5.1)
Sodium: 137 mEq/L (ref 135–145)
Total Bilirubin: 0.7 mg/dL (ref 0.2–1.2)
Total Protein: 7 g/dL (ref 6.0–8.3)

## 2020-04-23 LAB — URIC ACID: Uric Acid, Serum: 4.7 mg/dL (ref 4.0–7.8)

## 2020-04-23 LAB — HEMOGLOBIN A1C: Hgb A1c MFr Bld: 5.7 % (ref 4.6–6.5)

## 2020-04-23 LAB — PSA: PSA: 1.06 ng/mL (ref 0.10–4.00)

## 2020-05-17 ENCOUNTER — Encounter: Payer: Self-pay | Admitting: Family Medicine

## 2020-05-17 DIAGNOSIS — M545 Low back pain, unspecified: Secondary | ICD-10-CM

## 2020-05-20 ENCOUNTER — Ambulatory Visit
Admission: RE | Admit: 2020-05-20 | Discharge: 2020-05-20 | Disposition: A | Discharge status: Home / Self Care | Payer: 59 | Source: Ambulatory Visit | Attending: Family Medicine | Admitting: Family Medicine

## 2020-05-20 ENCOUNTER — Other Ambulatory Visit: Payer: Self-pay | Admitting: Family Medicine

## 2020-05-20 ENCOUNTER — Other Ambulatory Visit: Payer: Self-pay

## 2020-05-20 ENCOUNTER — Ambulatory Visit (INDEPENDENT_AMBULATORY_CARE_PROVIDER_SITE_OTHER): Payer: 59 | Admitting: Neurology

## 2020-05-20 ENCOUNTER — Encounter: Payer: Self-pay | Admitting: Neurology

## 2020-05-20 VITALS — BP 135/71 | HR 51 | Ht 70.0 in | Wt 170.3 lb

## 2020-05-20 DIAGNOSIS — R0981 Nasal congestion: Secondary | ICD-10-CM | POA: Diagnosis not present

## 2020-05-20 DIAGNOSIS — R351 Nocturia: Secondary | ICD-10-CM

## 2020-05-20 DIAGNOSIS — R0683 Snoring: Secondary | ICD-10-CM | POA: Diagnosis not present

## 2020-05-20 DIAGNOSIS — E785 Hyperlipidemia, unspecified: Secondary | ICD-10-CM

## 2020-05-20 DIAGNOSIS — G2581 Restless legs syndrome: Secondary | ICD-10-CM

## 2020-05-20 DIAGNOSIS — G4719 Other hypersomnia: Secondary | ICD-10-CM | POA: Diagnosis not present

## 2020-05-20 DIAGNOSIS — M545 Low back pain, unspecified: Secondary | ICD-10-CM

## 2020-05-20 MED ORDER — METHYLPREDNISOLONE ACETATE 40 MG/ML INJ SUSP (RADIOLOG
120.0000 mg | Freq: Once | INTRAMUSCULAR | Status: AC
  Administered 2020-05-20: 120 mg via EPIDURAL

## 2020-05-20 MED ORDER — IOPAMIDOL (ISOVUE-M 200) INJECTION 41%
1.0000 mL | Freq: Once | INTRAMUSCULAR | Status: AC
  Administered 2020-05-20: 1 mL via EPIDURAL

## 2020-05-20 NOTE — Discharge Instructions (Signed)

## 2020-05-20 NOTE — Progress Notes (Signed)
Subjective:    Patient ID: Joshua [name] is a 65 y.o. male.  HPI     Star Age, MD, PhD [facility] [address], [address] [place], [place] [address]  Dear Janett Billow,   I saw your patient, Joshua [name], upon your kind request, in my sleep clinic today for initial consultation of his sleep disorder, in particular, concern for underlying obstructive sleep apnea.  The patient is unaccompanied today.  As you know, Joshua [name] is a 65 year old right-handed gentleman with an underlying medical history of allergies, arthritis, asthma, back pain, chronic kidney disease, reflux disease, gout, hypertension, hyperlipidemia, and kidney stones, who reports snoring and excessive daytime somnolence.  He reports that he wakes up with inability to breathe appropriately through his nose.  He starts off okay and feels like he builds up mucus.  He does have allergy symptoms, mostly seasonal, worst month is typically April but denies any day-to-day postnasal drip or allergy symptoms.  He has recently started at your recommendation Nasacort and feels that his nasal breathing has indeed improved in the past couple of weeks.  He denies any nighttime gasping sensations or morning headaches, he does snore per wife and has not been told that he has breathing pauses while asleep.  He does report occasional restless leg symptoms but has not been reported to have twitching or kicking in his sleep.  They have been married for about 3 years, he has 1 grown daughter and she has 3 grown sons.  He is a non-smoker and drinks alcohol very occasionally or rarely, maybe 1 beer a month and caffeine in the form of a caffeine pill 1 a day.  He is not aware of any family history of sleep apnea.  He has nocturia about once per average night.  He denies any nighttime chest pain or shortness of breath or palpitations.  I reviewed your office note from 04/22/2020.  His Epworth sleepiness score is 12/24.  He  generally goes to bed between 10 and 10:30 PM and rise time is between 5 and 6.  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  He does take Benadryl 25 mg strength half a pill almost each night.  Sometimes he has difficulty maintaining sleep.  His Past Medical History Is Significant For: Past Medical History:  Diagnosis Date  . Allergy   . Arthritis   . Asthma   . Chronic kidney disease   . GERD (gastroesophageal reflux disease)   . Gout   . Hyperlipidemia   . Hypertension   . Kidney stones     His Past Surgical History Is Significant For: Past Surgical History:  Procedure Laterality Date  . kidney stones    . LITHOTRIPSY      His Family History Is Significant For: Family History  Problem Relation Age of Onset  . Hyperlipidemia Mother   . Diabetes Mother   . Pancreatic cancer Mother   . Alcohol abuse Father   . Alcohol abuse Brother   . Drug abuse Brother   . Alcohol abuse Maternal Grandmother   . Hypertension Maternal Grandfather   . Hypertension Paternal Grandfather   . Cancer Sister   . Cancer Paternal Aunt   . Colon cancer Paternal Aunt   . Esophageal cancer Neg Hx   . Liver cancer Neg Hx   . Rectal cancer Neg Hx   . Stomach cancer Neg Hx     His  Social History Is Significant For: Social History   Socioeconomic History  . Marital status: Married    Spouse name: Not on file  . Number of children: Not on file  . Years of education: Not on file  . Highest education level: Not on file  Occupational History  . Not on file  Tobacco Use  . Smoking status: Never Smoker  . Smokeless tobacco: Never Used  Substance and Sexual Activity  . Alcohol use: Yes    Comment: Occasional  . Drug use: No  . Sexual activity: Yes    Birth control/protection: None  Other Topics Concern  . Not on file  Social History Narrative  . Not on file   Social Determinants of Health   Financial Resource Strain: Not on file  Food  Insecurity: Not on file  Transportation Needs: Not on file  Physical Activity: Not on file  Stress: Not on file  Social Connections: Not on file    His Allergies Are:  Allergies  Allergen Reactions  . Aspirin Anaphylaxis  . Shrimp [Shellfish Allergy] Itching and Other (See Comments)    Almond cherry Shrimp causes stomach upset, tingling of tongue and throat sometimes  . Benadryl [Diphenhydramine Hcl] Rash    Allergic to the topical only  . Other Itching    almonds  :   His Current Medications Are:  Outpatient Encounter Medications as of 05/20/2020  Medication Sig  . acetaminophen (TYLENOL) 500 MG tablet Take 500 mg by mouth every 6 (six) hours as needed.  Marland Kitchen allopurinol (ZYLOPRIM) 300 MG tablet TAKE 1 TABLET BY MOUTH EVERY DAY  . atorvastatin (LIPITOR) 40 MG tablet Take 1 tablet (40 mg total) by mouth daily.  . benazepril (LOTENSIN) 5 MG tablet Take 0.5 tablets (2.5 mg total) by mouth daily.  . colchicine 0.6 MG tablet Take 2 pills at first sign of gout, then take 1 pil an hour later  . hydrochlorothiazide (HYDRODIURIL) 25 MG tablet Take 1 tablet (25 mg total) by mouth daily.  Marland Kitchen ibuprofen (ADVIL,MOTRIN) 200 MG tablet Take 600 mg by mouth every 6 (six) hours as needed for pain.  . Triamcinolone Acetonide (NASACORT AQ NA) Place into the nose.   Facility-Administered Encounter Medications as of 05/20/2020  Medication  . 0.9 %  sodium chloride infusion  :  Review of Systems:  Out of a complete 14 point review of systems, all are reviewed and negative with the exception of these symptoms as listed below: Review of Systems  Neurological:       Here for sleep consult. No prior sleep study reports he does snore at night. Epworth Sleepiness Scale 0= would never doze 1= slight chance of dozing 2= moderate chance of dozing 3= high chance of dozing  Sitting and reading:2 Watching TV:2 Sitting inactive in a public place (ex. Theater or meeting):0 As a passenger in a car for an hour  without a break:1 Lying down to rest in the afternoon:3 Sitting and talking to someone:0 Sitting quietly after lunch (no alcohol):2 In a car, while stopped in traffic:2 Total:12     Objective:  Neurological Exam  Physical Exam Physical Examination:   Vitals:   05/20/20 0901  BP: 135/71  Pulse: (!) 51    General Examination: The patient is a very pleasant 65 y.o. male in no acute distress. He appears well-developed and well-nourished and well groomed.   HEENT: Normocephalic, atraumatic, pupils are equal, round and reactive to light, extraocular tracking is good without limitation to gaze  excursion or nystagmus noted. Hearing is grossly intact. Face is symmetric with normal facial animation. Speech is clear with no dysarthria noted. There is no hypophonia. There is no lip, neck/head, jaw or voice tremor. Neck is supple with full range of passive and active motion. There are no carotid bruits on auscultation. Oropharynx exam reveals: mild mouth dryness, adequate dental hygiene and full braces in place, moderate airway crowding, due to somewhat redundant soft palate and wider uvula, which reaches further down, Mallampati class III, tonsils of about 1-2+ on the left and 1+ on the right.  Neck circumference of 15-1/2 inches.  He has a minimal to mild overbite.  Tongue protrudes centrally and palate elevates symmetrically.  Nasal inspection reveals no significant inferior turbinate hypertrophy or septal deviation but he does have some mucosal bogginess.  Chest: Clear to auscultation without wheezing, rhonchi or crackles noted.  Heart: S1+S2+0, regular and normal without murmurs, rubs or gallops noted.   Abdomen: Soft, non-tender and non-distended with normal bowel sounds appreciated on auscultation.  Extremities: There is no pitting edema in the distal lower extremities bilaterally.   Skin: Warm and dry without trophic changes noted.   Musculoskeletal: exam reveals no obvious joint  deformities, tenderness or joint swelling or erythema.   Neurologically:  Mental status: The patient is awake, alert and oriented in all 4 spheres. His immediate and remote memory, attention, language skills and fund of knowledge are appropriate. There is no evidence of aphasia, agnosia, apraxia or anomia. Speech is clear with normal prosody and enunciation. Thought process is linear. Mood is normal and affect is normal.  Cranial nerves II - XII are as described above under HEENT exam.  Motor exam: Normal bulk, strength and tone is noted. There is no tremor, Romberg is negative. Fine motor skills and coordination: grossly intact.  Cerebellar testing: No dysmetria or intention tremor. There is no truncal or gait ataxia.  Sensory exam: intact to light touch in the upper and lower extremities.  Gait, station and balance: He stands easily. No veering to one side is noted. No leaning to one side is noted. Posture is age-appropriate and stance is narrow based. Gait shows normal stride length and normal pace. No problems turning are noted. Tandem walk is unremarkable.                Assessment and Plan:  In summary, Joshua [name] is a very pleasant 65 y.o.-year old male with an underlying medical history of allergies, arthritis, asthma, back pain, chronic kidney disease, reflux disease, gout, hypertension, hyperlipidemia, and kidney stones, whose history and physical exam are concerning for obstructive sleep apnea (OSA). I had a long chat with the patient about my findings and the diagnosis of OSA, its prognosis and treatment options. We talked about medical treatments, surgical interventions and non-pharmacological approaches. I explained in particular the risks and ramifications of untreated moderate to severe OSA, especially with respect to developing cardiovascular disease down the Road, including congestive heart failure, difficult to treat hypertension, cardiac arrhythmias, or stroke. Even type 2  diabetes has, in part, been linked to untreated OSA. Symptoms of untreated OSA include daytime sleepiness, memory problems, mood irritability and mood disorder such as depression and anxiety, lack of energy, as well as recurrent headaches, especially morning headaches. We talked about trying to maintain a healthy lifestyle in general, as well as the importance of weight control. We also talked about the importance of good sleep hygiene. I recommended the following at this time: sleep study.  I explained the sleep test procedure to the patient and also outlined possible surgical and non-surgical treatment options of OSA, including the use of a custom-made dental device (which would require a referral to a specialist dentist or oral surgeon), upper airway surgical options, such as traditional UPPP or a novel less invasive surgical option in the form of Inspire hypoglossal nerve stimulation (which would involve a referral to an ENT surgeon). I also explained the CPAP treatment option to the patient, who indicated that he would be willing to try CPAP if the need arises. I explained the importance of being compliant with PAP treatment, not only for insurance purposes but primarily to improve His symptoms, and for the patient's long term health benefit, including to reduce His cardiovascular risks. I answered all his questions today and the patient was in agreement. I plan to see him back after the sleep study is completed and encouraged him to call with any interim questions, concerns, problems or updates.   Thank you very much for allowing me to participate in the care of this nice patient. If I can be of any further assistance to you please do not hesitate to call me at [phone].  Sincerely,   Star Age, MD, PhD

## 2020-05-20 NOTE — Patient Instructions (Signed)

## 2020-05-24 ENCOUNTER — Telehealth: Payer: Self-pay

## 2020-05-24 NOTE — Telephone Encounter (Signed)
LVM for pt to call me back to schedule sleep study  

## 2020-05-30 ENCOUNTER — Other Ambulatory Visit: Payer: Self-pay | Admitting: Family Medicine

## 2020-05-30 DIAGNOSIS — I10 Essential (primary) hypertension: Secondary | ICD-10-CM

## 2020-06-09 ENCOUNTER — Ambulatory Visit (INDEPENDENT_AMBULATORY_CARE_PROVIDER_SITE_OTHER): Payer: 59 | Admitting: Neurology

## 2020-06-09 DIAGNOSIS — R0683 Snoring: Secondary | ICD-10-CM

## 2020-06-09 DIAGNOSIS — G4710 Hypersomnia, unspecified: Secondary | ICD-10-CM | POA: Diagnosis not present

## 2020-06-09 DIAGNOSIS — R0981 Nasal congestion: Secondary | ICD-10-CM

## 2020-06-09 DIAGNOSIS — G2581 Restless legs syndrome: Secondary | ICD-10-CM

## 2020-06-09 DIAGNOSIS — R351 Nocturia: Secondary | ICD-10-CM

## 2020-06-09 DIAGNOSIS — G4719 Other hypersomnia: Secondary | ICD-10-CM

## 2020-06-10 NOTE — Procedures (Signed)
   [facility] [place] ([facility])  STUDY DATE: 06-09-20  DOB: 13-Jan-1956  MRN: [id number]  ORDERING CLINICIAN: Star Age, MD, PhD   REFERRING CLINICIAN: Copland, Gay Filler, MD   CLINICAL INFORMATION/HISTORY: 65 year old man with a history of allergies, arthritis, asthma, back pain, chronic kidney disease, reflux disease, gout, hypertension, hyperlipidemia, and kidney stones, who reports snoring and excessive daytime somnolence.    Epworth sleepiness score: 12/24.  BMI: 24.3 kg/m  Neck Circumference: 15.5 "  FINDINGS:   Total Record Time (hours, min): 8 H 11 min  Total Sleep Time (hours, min):  7 H 4 min   Percent REM (%):    39.33 %   Calculated pAHI (per hour): 307      REM pAHI: 6.5    NREM pAHI: 1.9 Supine AHI: 4.4   Oxygen Saturation (%) Mean: 95  Minimum oxygen saturation (%):         92   O2 Saturation Range (%): 92-99  O2Saturation (minutes) <=88%: 0 min  Pulse Mean (bpm):    58  Pulse Range (58-91)   IMPRESSION: Primary snoring   RECOMMENDATION:  This home sleep test does not demonstrate any significant obstructive or central sleep disordered breathing. Some snoring was noted, but appeared to be mild and intermittent. Other causes of the patient's symptoms, including circadian rhythm disturbances, an underlying mood disorder, medication effect and/or an underlying medical problem cannot be ruled out based on this test. Clinical correlation is recommended. The patient should be cautioned not to drive, work at heights, or operate dangerous or heavy equipment when tired or sleepy. Review and reiteration of good sleep hygiene measures should be pursued with any patient. The patient can follow up with his referring provider, who will be notified of the test results. An appointment in sleep clinic can be made as necessary.   I certify that I have reviewed the raw data recording prior to the issuance of this report in accordance with the standards  of the American Academy of Sleep Medicine (AASM).  INTERPRETING PHYSICIAN:  Star Age, MD, PhD  Board Certified in Neurology and Sleep Medicine  [facility] [address] [place], [place] [address] [phone]

## 2020-06-10 NOTE — Progress Notes (Signed)
Patient referred by Dr. Lorelei Pont, seen by me on 05/20/20, HST on 06/09/20.   Please call and notify the patient that the recent home sleep test did not show any significant obstructive sleep apnea. Mild intermittent snoring was noted. He may benefit from seeing an allergy specialist, based on this symptoms; he can d/w PCP. He can FU with Dr. Lorelei Pont as scheduled.   Thanks,  Star Age, MD, PhD [facility])

## 2020-06-13 ENCOUNTER — Encounter: Payer: Self-pay | Admitting: Neurology

## 2020-07-05 ENCOUNTER — Encounter: Payer: Self-pay | Admitting: Family Medicine

## 2020-07-06 NOTE — Telephone Encounter (Signed)
Called and spoke with patient on phone he states his concerns actually resolved. He requested to cancel next weeks appt. I have done so. No concerns for now per patient.

## 2020-07-14 ENCOUNTER — Ambulatory Visit: Payer: 59 | Admitting: Family Medicine

## 2020-10-19 ENCOUNTER — Other Ambulatory Visit: Payer: Self-pay | Admitting: Family Medicine

## 2020-10-19 DIAGNOSIS — I10 Essential (primary) hypertension: Secondary | ICD-10-CM

## 2020-11-22 ENCOUNTER — Encounter: Payer: Self-pay | Admitting: Family Medicine

## 2020-11-22 NOTE — Addendum Note (Signed)
Addended by: Lamar Blinks C on: 11/22/2020 07:11 PM   Modules accepted: Orders

## 2020-11-26 ENCOUNTER — Other Ambulatory Visit: Payer: Self-pay | Admitting: Family Medicine

## 2020-11-26 DIAGNOSIS — M109 Gout, unspecified: Secondary | ICD-10-CM

## 2020-11-30 ENCOUNTER — Other Ambulatory Visit: Payer: Self-pay

## 2020-11-30 ENCOUNTER — Ambulatory Visit
Admission: RE | Admit: 2020-11-30 | Discharge: 2020-11-30 | Disposition: A | Discharge status: Home / Self Care | Payer: 59 | Source: Ambulatory Visit | Attending: Family Medicine | Admitting: Family Medicine

## 2020-11-30 ENCOUNTER — Other Ambulatory Visit: Payer: Self-pay | Admitting: Family Medicine

## 2020-11-30 DIAGNOSIS — M545 Low back pain, unspecified: Secondary | ICD-10-CM

## 2020-11-30 MED ORDER — IOPAMIDOL (ISOVUE-M 200) INJECTION 41%
1.0000 mL | Freq: Once | INTRAMUSCULAR | Status: AC
  Administered 2020-11-30: 1 mL via EPIDURAL

## 2020-11-30 MED ORDER — METHYLPREDNISOLONE ACETATE 40 MG/ML INJ SUSP (RADIOLOG
80.0000 mg | Freq: Once | INTRAMUSCULAR | Status: AC
  Administered 2020-11-30: 80 mg via EPIDURAL

## 2020-11-30 NOTE — Discharge Instructions (Signed)

## 2020-12-13 ENCOUNTER — Encounter: Payer: Self-pay | Admitting: Family Medicine

## 2020-12-13 DIAGNOSIS — M545 Low back pain, unspecified: Secondary | ICD-10-CM

## 2020-12-13 NOTE — Addendum Note (Signed)
Addended by: Lamar Blinks C on: 12/13/2020 01:51 PM   Modules accepted: Orders

## 2021-01-17 ENCOUNTER — Other Ambulatory Visit: Payer: Self-pay | Admitting: Family Medicine

## 2021-01-17 DIAGNOSIS — I10 Essential (primary) hypertension: Secondary | ICD-10-CM

## 2021-02-07 ENCOUNTER — Encounter: Payer: Self-pay | Admitting: Family Medicine

## 2021-02-24 ENCOUNTER — Other Ambulatory Visit: Payer: Self-pay | Admitting: Family Medicine

## 2021-02-24 DIAGNOSIS — E785 Hyperlipidemia, unspecified: Secondary | ICD-10-CM

## 2021-02-24 NOTE — Telephone Encounter (Signed)
Called pt and informed him that he will need an appointment for additional refills. We have filled for 90 days and pt will give [place] a call back to get a f/u appt.

## 2021-03-09 ENCOUNTER — Encounter: Payer: Self-pay | Admitting: Family Medicine

## 2021-03-09 ENCOUNTER — Telehealth: Payer: 59 | Admitting: Physician Assistant

## 2021-03-09 NOTE — Progress Notes (Signed)
Patient is in [place]

## 2021-04-19 NOTE — Patient Instructions (Addendum)
It was great to see you again today, I will be in touch with your labs asap If your D dimer is positive we will get a CT of your lungs to rule out a pulmonary embolism/ blood clot If negative we will get a chest x-ray and think about further cardiac eval Will also get a carotid US for you   Remember if you need your colchicine for gout, hold the cholesterol med (atorvastatin) for about 72 hours after use  Say hi to [name] for me please!

## 2021-04-19 NOTE — Progress Notes (Addendum)
[facility] [address], [place], [place] [address] [phone]  Date:  04/21/2021   Name:  Joshua [name]   DOB:  1956-02-26   MRN:  [id number]  PCP:  Darreld Mclean, MD    Chief Complaint: Annual Exam (Concerns/ questions: 1. pt says he is on the tail end of a cold x 1 week.  2. Has covid around christmas/Flu shot today: declines/)   History of Present Illness:  Joshua [name] is a 66 y.o. very pleasant male patient who presents with the following:  Seen today for physical exam Most recent visit with myself about 1 year ago History of hypertension, dyslipidemia, gout He rarely has gout sx with his allopurinol He enjoys running, he met his current wife [name] through a local running group after his first wife passed away He has 1 grandson who is 96 years old now  At our visit last year he was having more difficulty with his lumbar back pain.  He had a couple of lumbar steroid injections last year We also referred him to neurosurgery-he saw Dr. Christella Noa in October, he ordered an MRI which I cannot view He was in [place] over Christmas holidays visiting family and got COVID-19 He was treated with antivirals - paxlovid- and got better quickly   COVID-19 booster Flu shot- did not do this year; he plans to do next year Colon cancer screening is up-to-date Most recent labs 1 year ago Shingrix is complete  Atorvastatin Benazepril Allopurinol HCTZ 25 Colchicine as needed  Pt is a serious runner- he notes that the first couple of weeks after getting back from covid he would be quite SOB which he expected. He has improved but he is not back to his full form.  He notes that after about 10 to 12 miles he will feel quite fatigued and more short of breath than he would expect.  This seems different to him than when he has had to recover from injuries, etc.  In the past He is not having any chest pain Patient Active Problem  List   Diagnosis Date Noted   Acute pain of right knee 09/30/2018   Lumbar back pain 02/18/2018   Acute non-recurrent frontal sinusitis 04/17/2017   Chronic neck pain 09/06/2014   Other and unspecified hyperlipidemia 12/02/2013   HTN (hypertension) 12/02/2013   Gout 12/02/2013   Meralgia paresthetica of right side 12/25/2012   Seasonal allergic rhinitis 07/20/2012    Past Medical History:  Diagnosis Date   Allergy    Arthritis    Asthma    Chronic kidney disease    GERD (gastroesophageal reflux disease)    Gout    Hyperlipidemia    Hypertension    Kidney stones     Past Surgical History:  Procedure Laterality Date   kidney stones     LITHOTRIPSY      Social History   Tobacco Use   Smoking status: Never   Smokeless tobacco: Never  Substance Use Topics   Alcohol use: Yes    Comment: Occasional   Drug use: No    Family History  Problem Relation Age of Onset   Hyperlipidemia Mother    Diabetes Mother    Pancreatic cancer Mother    Alcohol abuse Father    Alcohol abuse Brother    Drug abuse Brother    Alcohol abuse Maternal Grandmother    Hypertension Maternal Grandfather    Hypertension Paternal  Grandfather    Cancer Sister    Cancer Paternal Aunt    Colon cancer Paternal Aunt    Esophageal cancer Neg Hx    Liver cancer Neg Hx    Rectal cancer Neg Hx    Stomach cancer Neg Hx     Allergies  Allergen Reactions   Aspirin Anaphylaxis   Shrimp [Shellfish Allergy] Itching and Other (See Comments)    Almond cherry Shrimp causes stomach upset, tingling of tongue and throat sometimes   Benadryl [Diphenhydramine Hcl] Rash    Allergic to the topical only   Other Itching    almonds    Medication list has been reviewed and updated.  Current Outpatient Medications on File Prior to Visit  Medication Sig Dispense Refill   acetaminophen (TYLENOL) 500 MG tablet Take 500 mg by mouth every 6 (six) hours as needed.     allopurinol (ZYLOPRIM) 300 MG tablet TAKE 1  TABLET BY MOUTH EVERY DAY 90 tablet 3   atorvastatin (LIPITOR) 40 MG tablet TAKE 1 TABLET BY MOUTH EVERY DAY 90 tablet 0   benazepril (LOTENSIN) 5 MG tablet TAKE 1/2 TABLET BY MOUTH EVERY DAY 45 tablet 0   colchicine 0.6 MG tablet Take 2 pills at first sign of gout, then take 1 pil an hour later 45 tablet 0   hydrochlorothiazide (HYDRODIURIL) 25 MG tablet Take 1 tablet (25 mg total) by mouth daily. 90 tablet 3   ibuprofen (ADVIL,MOTRIN) 200 MG tablet Take 600 mg by mouth every 6 (six) hours as needed for pain.     Triamcinolone Acetonide (NASACORT AQ NA) Place into the nose.     Current Facility-Administered Medications on File Prior to Visit  Medication Dose Route Frequency Provider Last Rate Last Admin   0.9 %  sodium chloride infusion  500 mL Intravenous Once Danis, Estill Cotta III, MD        Review of Systems:  As per HPI- otherwise negative.   Physical Examination: Vitals:   04/21/21 0833  BP: 124/84  Pulse: 71  Resp: 18  Temp: 98 F (36.7 C)  SpO2: 99%   Vitals:   04/21/21 0833  Weight: 170 lb 6.4 oz (77.3 kg)  Height: '5\' 10"'  (1.778 m)   Body mass index is 24.45 kg/m. Ideal Body Weight: Weight in (lb) to have BMI = 25: 173.9  GEN: no acute distress.  Fit build, looks well Bilateral TM wnl, oropharynx normal.  PEERL,EOMI.   HEENT: Atraumatic, Normocephalic.  Ears and Nose: No external deformity. CV: RRR, No M/G/R. No JVD. No thrill. No extra heart sounds. PULM: CTA B, no wheezes, crackles, rhonchi. No retractions. No resp. distress. No accessory muscle use. ABD: S, NT, ND, +BS. No rebound. No HSM. EXTR: No c/c/e PSYCH: Normally interactive. Conversant.  No calf swelling or tenderness   EKG: SR, no significant change c/w 2020 Normal tracing   Assessment and Plan: Physical exam  Hyperlipidemia, unspecified hyperlipidemia type - Plan: Lipid panel, atorvastatin (LIPITOR) 40 MG tablet  Idiopathic chronic gout of multiple sites without tophus - Plan: Uric  acid  Pre-diabetes - Plan: Comprehensive metabolic panel, Hemoglobin A1c  Screening for deficiency anemia - Plan: CBC  Essential hypertension - Plan: CBC, Comprehensive metabolic panel, benazepril (LOTENSIN) 5 MG tablet, hydrochlorothiazide (HYDRODIURIL) 25 MG tablet  Lumbar back pain  Screening for prostate cancer - Plan: PSA  SOB (shortness of breath) - Plan: EKG 12-Lead, D-Dimer, Quantitative  Bilateral carotid artery stenosis - Plan: US Carotid Duplex Bilateral   Physical  exam today.  Encouraged continued healthy diet and exercise routine Patient had COVID-19 after Christmas, he notes that he is back to full running routine but he is not feeling quite his normal self.  We discussed this in detail.  Certainly need to consider pulmonary embolism given recent COVID-19 infection.  We will obtain a D-dimer today, patient understands if positive we will then do a CT angiogram If this is normal we might consider further cardiac work-up, perhaps a coronary calcium score Patient also notes he had a carotid ultrasound which showed some degree of blockage perhaps 20 years ago.  He was not sure if follow-up was needed-we will go ahead and get a repeat carotid ultrasound  Signed Joshua Blinks, MD  Received labs- message to pt Results for orders placed or performed in visit on 04/21/21  CBC  Result Value Ref Range   WBC 8.9 4.0 - 10.5 K/uL   RBC 4.92 4.22 - 5.81 Mil/uL   Platelets 238.0 150.0 - 400.0 K/uL   Hemoglobin 14.6 13.0 - 17.0 g/dL   HCT 42.6 39.0 - 52.0 %   MCV 86.6 78.0 - 100.0 fl   MCHC 34.2 30.0 - 36.0 g/dL   RDW 13.5 11.5 - 15.5 %  Comprehensive metabolic panel  Result Value Ref Range   Sodium 136 135 - 145 mEq/L   Potassium 4.8 3.5 - 5.1 mEq/L   Chloride 97 96 - 112 mEq/L   CO2 33 (H) 19 - 32 mEq/L   Glucose, Bld 93 70 - 99 mg/dL   BUN 16 6 - 23 mg/dL   Creatinine, Ser 0.93 0.40 - 1.50 mg/dL   Total Bilirubin 1.0 0.2 - 1.2 mg/dL   Alkaline Phosphatase 108 39 - 117  U/L   AST 27 0 - 37 U/L   ALT 24 0 - 53 U/L   Total Protein 7.0 6.0 - 8.3 g/dL   Albumin 4.6 3.5 - 5.2 g/dL   GFR 86.99 >60.00 mL/min   Calcium 9.9 8.4 - 10.5 mg/dL  Hemoglobin A1c  Result Value Ref Range   Hgb A1c MFr Bld 5.6 4.6 - 6.5 %  Lipid panel  Result Value Ref Range   Cholesterol 138 0 - 200 mg/dL   Triglycerides 54.0 0.0 - 149.0 mg/dL   HDL 53.20 >39.00 mg/dL   VLDL 10.8 0.0 - 40.0 mg/dL   LDL Cholesterol 74 0 - 99 mg/dL   Total CHOL/HDL Ratio 3    NonHDL 85.25   Uric acid  Result Value Ref Range   Uric Acid, Serum 6.6 4.0 - 7.8 mg/dL  PSA  Result Value Ref Range   PSA 1.38 0.10 - 4.00 ng/mL  D-Dimer, Quantitative  Result Value Ref Range   D-Dimer, Quant 0.22 <0.50 mcg/mL FEU

## 2021-04-21 ENCOUNTER — Encounter: Payer: Self-pay | Admitting: Family Medicine

## 2021-04-21 ENCOUNTER — Ambulatory Visit (INDEPENDENT_AMBULATORY_CARE_PROVIDER_SITE_OTHER): Payer: 59 | Admitting: Family Medicine

## 2021-04-21 VITALS — BP 124/84 | HR 71 | Temp 98.0°F | Resp 18 | Ht 70.0 in | Wt 170.4 lb

## 2021-04-21 DIAGNOSIS — M545 Low back pain, unspecified: Secondary | ICD-10-CM

## 2021-04-21 DIAGNOSIS — I6523 Occlusion and stenosis of bilateral carotid arteries: Secondary | ICD-10-CM

## 2021-04-21 DIAGNOSIS — R0602 Shortness of breath: Secondary | ICD-10-CM

## 2021-04-21 DIAGNOSIS — R7303 Prediabetes: Secondary | ICD-10-CM

## 2021-04-21 DIAGNOSIS — M1A09X0 Idiopathic chronic gout, multiple sites, without tophus (tophi): Secondary | ICD-10-CM | POA: Diagnosis not present

## 2021-04-21 DIAGNOSIS — Z0000 Encounter for general adult medical examination without abnormal findings: Secondary | ICD-10-CM

## 2021-04-21 DIAGNOSIS — I10 Essential (primary) hypertension: Secondary | ICD-10-CM

## 2021-04-21 DIAGNOSIS — Z125 Encounter for screening for malignant neoplasm of prostate: Secondary | ICD-10-CM

## 2021-04-21 DIAGNOSIS — E785 Hyperlipidemia, unspecified: Secondary | ICD-10-CM

## 2021-04-21 DIAGNOSIS — R0609 Other forms of dyspnea: Secondary | ICD-10-CM

## 2021-04-21 DIAGNOSIS — Z130 Encounter for screening for diseases of the blood and blood-forming organs and certain disorders involving the immune mechanism: Secondary | ICD-10-CM

## 2021-04-21 LAB — PSA: PSA: 1.38 ng/mL (ref 0.10–4.00)

## 2021-04-21 LAB — CBC
HCT: 42.6 % (ref 39.0–52.0)
Hemoglobin: 14.6 g/dL (ref 13.0–17.0)
MCHC: 34.2 g/dL (ref 30.0–36.0)
MCV: 86.6 fl (ref 78.0–100.0)
Platelets: 238 10*3/uL (ref 150.0–400.0)
RBC: 4.92 Mil/uL (ref 4.22–5.81)
RDW: 13.5 % (ref 11.5–15.5)
WBC: 8.9 10*3/uL (ref 4.0–10.5)

## 2021-04-21 LAB — COMPREHENSIVE METABOLIC PANEL
ALT: 24 U/L (ref 0–53)
AST: 27 U/L (ref 0–37)
Albumin: 4.6 g/dL (ref 3.5–5.2)
Alkaline Phosphatase: 108 U/L (ref 39–117)
BUN: 16 mg/dL (ref 6–23)
CO2: 33 mEq/L — ABNORMAL HIGH (ref 19–32)
Calcium: 9.9 mg/dL (ref 8.4–10.5)
Chloride: 97 mEq/L (ref 96–112)
Creatinine, Ser: 0.93 mg/dL (ref 0.40–1.50)
GFR: 86.99 mL/min (ref >60.00)
Glucose, Bld: 93 mg/dL (ref 70–99)
Potassium: 4.8 mEq/L (ref 3.5–5.1)
Sodium: 136 mEq/L (ref 135–145)
Total Bilirubin: 1 mg/dL (ref 0.2–1.2)
Total Protein: 7 g/dL (ref 6.0–8.3)

## 2021-04-21 LAB — LIPID PANEL
Cholesterol: 138 mg/dL (ref 0–200)
HDL: 53.2 mg/dL (ref >39.00)
LDL Cholesterol: 74 mg/dL (ref 0–99)
NonHDL: 85.25
Total CHOL/HDL Ratio: 3
Triglycerides: 54 mg/dL (ref 0.0–149.0)
VLDL: 10.8 mg/dL (ref 0.0–40.0)

## 2021-04-21 LAB — URIC ACID: Uric Acid, Serum: 6.6 mg/dL (ref 4.0–7.8)

## 2021-04-21 LAB — HEMOGLOBIN A1C: Hgb A1c MFr Bld: 5.6 % (ref 4.6–6.5)

## 2021-04-21 LAB — D-DIMER, QUANTITATIVE: D-Dimer, Quant: 0.22 mcg/mL FEU (ref <0.50)

## 2021-04-21 MED ORDER — BENAZEPRIL HCL 5 MG PO TABS
2.5000 mg | ORAL_TABLET | Freq: Every day | ORAL | 3 refills | Status: DC
Start: 2021-04-21 — End: 2022-04-24

## 2021-04-21 MED ORDER — ATORVASTATIN CALCIUM 40 MG PO TABS
40.0000 mg | ORAL_TABLET | Freq: Every day | ORAL | 3 refills | Status: DC
Start: 2021-04-21 — End: 2022-02-21

## 2021-04-21 MED ORDER — HYDROCHLOROTHIAZIDE 25 MG PO TABS: 25.0000 mg | ORAL_TABLET | Freq: Every day | ORAL | 3 refills | Status: DC

## 2021-04-26 ENCOUNTER — Ambulatory Visit
Admission: RE | Admit: 2021-04-26 | Discharge: 2021-04-26 | Disposition: A | Discharge status: Home / Self Care | Payer: 59 | Source: Ambulatory Visit | Attending: Family Medicine | Admitting: Family Medicine

## 2021-04-26 ENCOUNTER — Encounter: Payer: Self-pay | Admitting: Family Medicine

## 2021-04-26 DIAGNOSIS — I6523 Occlusion and stenosis of bilateral carotid arteries: Secondary | ICD-10-CM

## 2021-09-29 ENCOUNTER — Ambulatory Visit (INDEPENDENT_AMBULATORY_CARE_PROVIDER_SITE_OTHER): Payer: 59 | Admitting: Family Medicine

## 2021-09-29 ENCOUNTER — Encounter: Payer: Self-pay | Admitting: Family Medicine

## 2021-09-29 ENCOUNTER — Telehealth: Payer: Self-pay

## 2021-09-29 VITALS — BP 132/84 | HR 78 | Temp 97.7°F | Resp 16 | Ht 70.0 in | Wt 172.0 lb

## 2021-09-29 DIAGNOSIS — M79674 Pain in right toe(s): Secondary | ICD-10-CM

## 2021-09-29 MED ORDER — CEPHALEXIN 500 MG PO CAPS: 500.0000 mg | ORAL_CAPSULE | Freq: Three times a day (TID) | ORAL | 0 refills | Status: AC

## 2021-09-29 NOTE — Telephone Encounter (Signed)
Nurse Assessment Nurse: D'Heur Lucia Gaskins, RN, Adrienne Date/Time (Eastern Time): 09/29/2021 8:45:29 AM Confirm and document reason for call. If symptomatic, describe symptoms. ---Caller states he has a twitch in his right great toe that feels like a "grabbing" and is unsure if he has an infection. Started Saturday. It is just behind the base of the nail. He does see bruising under the nail but states this is normal, he runs marathons. When he runs his finger across the cuticle, it is sensitive. No swelling. No redness. Caller states he has nerve issues in that leg d/t lumbar disc compression. Does the patient have any new or worsening symptoms? ---Yes Will a triage be completed? ---Yes Related visit to physician within the last 2 weeks? ---No Does the PT have any chronic conditions? (i.e. diabetes, asthma, this includes High risk factors for pregnancy, etc.) ---Yes List chronic conditions. ---lumbar compression Is this a behavioral health or substance abuse call? ---No Guidelines Guideline Title Affirmed Question Affirmed Notes Nurse Date/Time Eilene Ghazi Time) Toe Pain [1] MILD pain (e.g., does not interfere with normal activities) AND [2] present > 7 days D'Heur Lucia Gaskins, RN, Vincente Liberty 09/29/2021 8:50:09 AM PLEASE NOTE: All timestamps contained within this report are represented as [place] Standard Time. CONFIDENTIALTY NOTICE: This fax transmission is intended only for the addressee. It contains information that is legally privileged, confidential or otherwise protected from use or disclosure. If you are not the intended recipient, you are strictly prohibited from reviewing, disclosing, copying using or disseminating any of this information or taking any action in reliance on or regarding this information. If you have received this fax in error, please notify us immediately by telephone so that we can arrange for its return to [place]. Phone: [phone], Toll-Free: [phone], Fax:  [phone] Page: 2 of 2 Call Id: [id number] [place]. Time Eilene Ghazi Time) Disposition Final User 09/29/2021 8:55:20 AM See PCP within 2 Weeks Yes D'Heur Lucia Gaskins, RN, Adrienne Final Disposition 09/29/2021 8:55:20 AM See PCP within 2 Weeks Yes [place], RN, Vincente Liberty Caller Disagree/Comply Comply Caller Understands Yes PreDisposition Call Doctor Care Advice Given Per Guideline SEE PCP WITHIN 2 WEEKS: * You need to be seen for this ongoing problem within the next 2 weeks. ALTERNATE DISPOSITION - PODIATRIST: * You may want to make an appointment with a foot specialist (podiatrist). * A podiatrist can examine your feet, check for problems, and treat many common foot problems. CALL BACK IF: * Fever occurs * Redness or swelling occur * You become worse CARE ADVICE given per Toe Pain (Adult) guideline. Referrals REFERRED TO PCP OFFICE

## 2021-09-29 NOTE — Telephone Encounter (Signed)
Appt scheduled w/ Dr. Nani Ravens.

## 2021-09-29 NOTE — Progress Notes (Signed)
Musculoskeletal Exam  Patient: Joshua [name] DOB: [birth year]  DOS: 09/29/2021  SUBJECTIVE:  Chief Complaint:   Chief Complaint  Patient presents with   Toe Injury    Here for toe injury    Joshua [name] is a 66 y.o.  male for evaluation and treatment of R great toe pain.   Onset:  3 days ago. No inj or change in activity.  Location:  Character:  throbbing  Progression of issue:  is unchanged Associated symptoms: slight redness, walking/running is unaffected No bruising, swelling, fevers, drainage Treatment: to date has been topical menthol sprays, ibuprofen, Tylenol, ice.   Neurovascular symptoms: no  Past Medical History:  Diagnosis Date   Allergy    Arthritis    Asthma    Chronic kidney disease    GERD (gastroesophageal reflux disease)    Gout    Hyperlipidemia    Hypertension    Kidney stones     Objective: VITAL SIGNS: BP 132/84 (BP Location: Right Arm, Patient Position: Sitting, Cuff Size: Normal)   Pulse 78   Temp 97.7 F (36.5 C) (Oral)   Resp 16   Ht '5\' 10"'$  (1.778 m)   Wt 172 lb (78 kg)   SpO2 98%   BMI 24.68 kg/m  Constitutional: Well formed, well developed. No acute distress. Thorax & Lungs: No accessory muscle use Musculoskeletal: R great toe.   Normal active range of motion: yes.   Normal passive range of motion: yes Tenderness to palpation: mild ttp over the dorsum of R great toe prox to nail No IP or MTP TTP/effusion Mild erythema and edema appreciated but no excessive warmth Deformity: no Ecchymosis: no Tests positive:  Tests negative: Neurologic: Normal sensory function. Gait normal.  Psychiatric: Normal mood. Age appropriate judgment and insight. Alert & oriented x 3.    Assessment:  Pain of toe of right foot - Plan: cephALEXin (KEFLEX) 500 MG capsule  Plan: 7 d of TID Keflex tx'ing for soft tissue infection, ice, Tylenol. If no improvement in 1 week, he will message and I will refer to sports med for possible further  imaging/eval.  Doubt gout given location of pain.  F/u prn. The patient voiced understanding and agreement to the plan.   [place], DO 09/29/21  2:15 PM

## 2021-09-29 NOTE — Patient Instructions (Signed)
Ice/cold pack over area for 10-15 min twice daily.  OK to take Tylenol 1000 mg (2 extra strength tabs) or 975 mg (3 regular strength tabs) every 6 hours as needed.  Send me a message if we aren't turning the corner.  Foods that may reduce pain: 1) Ginger 2) Blueberries 3) Salmon 4) Pumpkin seeds 5) dark chocolate 6) turmeric 7) tart cherries 8) virgin olive oil 9) chilli peppers 10) mint 11) krill oil  Let us know if you need anything.

## 2021-11-10 ENCOUNTER — Encounter: Payer: Self-pay | Admitting: Family Medicine

## 2021-11-10 DIAGNOSIS — U071 COVID-19: Secondary | ICD-10-CM

## 2021-11-10 MED ORDER — NIRMATRELVIR/RITONAVIR (PAXLOVID)TABLET: 3.0000 | ORAL_TABLET | Freq: Two times a day (BID) | ORAL | 0 refills | Status: AC

## 2021-11-24 ENCOUNTER — Other Ambulatory Visit: Payer: Self-pay | Admitting: Family Medicine

## 2021-11-24 DIAGNOSIS — M109 Gout, unspecified: Secondary | ICD-10-CM

## 2022-02-21 ENCOUNTER — Other Ambulatory Visit: Payer: Self-pay | Admitting: Family Medicine

## 2022-02-21 DIAGNOSIS — E785 Hyperlipidemia, unspecified: Secondary | ICD-10-CM

## 2022-02-21 DIAGNOSIS — I10 Essential (primary) hypertension: Secondary | ICD-10-CM

## 2022-04-20 ENCOUNTER — Ambulatory Visit (INDEPENDENT_AMBULATORY_CARE_PROVIDER_SITE_OTHER): Payer: 59 | Admitting: Internal Medicine

## 2022-04-20 ENCOUNTER — Encounter: Payer: Self-pay | Admitting: Internal Medicine

## 2022-04-20 VITALS — BP 118/66 | HR 60 | Temp 98.7°F | Ht 70.0 in | Wt 172.0 lb

## 2022-04-20 DIAGNOSIS — U071 COVID-19: Secondary | ICD-10-CM | POA: Diagnosis not present

## 2022-04-20 DIAGNOSIS — I10 Essential (primary) hypertension: Secondary | ICD-10-CM | POA: Diagnosis not present

## 2022-04-20 DIAGNOSIS — E7800 Pure hypercholesterolemia, unspecified: Secondary | ICD-10-CM

## 2022-04-20 LAB — POCT INFLUENZA A/B
Influenza A, POC: NEGATIVE
Influenza B, POC: NEGATIVE

## 2022-04-20 LAB — POCT RESPIRATORY SYNCYTIAL VIRUS: RSV Rapid Ag: NEGATIVE

## 2022-04-20 LAB — POC COVID19 BINAXNOW: SARS Coronavirus 2 Ag: POSITIVE — AB

## 2022-04-20 MED ORDER — HYDROCODONE BIT-HOMATROP MBR 5-1.5 MG/5ML PO SOLN
5.0000 mL | Freq: Four times a day (QID) | ORAL | 0 refills | Status: AC | PRN
Start: 2022-04-20 — End: 2022-04-30

## 2022-04-20 MED ORDER — NIRMATRELVIR/RITONAVIR (PAXLOVID)TABLET: 3.0000 | ORAL_TABLET | Freq: Two times a day (BID) | ORAL | 0 refills | Status: AC

## 2022-04-20 NOTE — Progress Notes (Signed)
Patient ID: Joshua [name], male   DOB: 1955-11-29, 67 y.o.   MRN: [id number]        Chief Complaint: follow up covid infection       HPI:  Joshua [name] is a 67 y.o. male   Here with 3 days acute onset fever, facial pain, pressure, headache, general weakness and malaise,, with mild ST and cough, but pt denies chest pain, wheezing, increased sob or doe, orthopnea, PND, increased LE swelling, palpitations, dizziness or syncope.    Pt denies polydipsia, polyuria, or new focal neuro s/s.    Pt denies fever, wt loss, night sweats, loss of appetite, or other constitutional symptoms        Wt Readings from Last 3 Encounters:  04/20/22 172 lb (78 kg)  09/29/21 172 lb (78 kg)  04/21/21 170 lb 6.4 oz (77.3 kg)   BP Readings from Last 3 Encounters:  04/20/22 118/66  09/29/21 132/84  04/21/21 124/84         Past Medical History:  Diagnosis Date   Allergy    Arthritis    Asthma    Chronic kidney disease    GERD (gastroesophageal reflux disease)    Gout    Hyperlipidemia    Hypertension    Kidney stones    Past Surgical History:  Procedure Laterality Date   kidney stones     LITHOTRIPSY      reports that he has never smoked. He has never used smokeless tobacco. He reports current alcohol use. He reports that he does not use drugs. family history includes Alcohol abuse in his brother, father, and maternal grandmother; Cancer in his paternal aunt and sister; Colon cancer in his paternal aunt; Diabetes in his mother; Drug abuse in his brother; Hyperlipidemia in his mother; Hypertension in his maternal grandfather and paternal grandfather; Pancreatic cancer in his mother. Allergies  Allergen Reactions   Aspirin Anaphylaxis   Shrimp [Shellfish Allergy] Itching and Other (See Comments)    Almond cherry Shrimp causes stomach upset, tingling of tongue and throat sometimes   Benadryl [Diphenhydramine Hcl] Rash    Allergic to the topical only   Other Itching    almonds   Current  Outpatient Medications on File Prior to Visit  Medication Sig Dispense Refill   acetaminophen (TYLENOL) 500 MG tablet Take 500 mg by mouth every 6 (six) hours as needed.     allopurinol (ZYLOPRIM) 300 MG tablet Take 1 tablet (300 mg total) by mouth daily. 90 tablet 1   atorvastatin (LIPITOR) 40 MG tablet TAKE 1 TABLET BY MOUTH EVERY DAY 90 tablet 3   benazepril (LOTENSIN) 5 MG tablet Take 0.5 tablets (2.5 mg total) by mouth daily. 45 tablet 3   colchicine 0.6 MG tablet Take 2 pills at first sign of gout, then take 1 pil an hour later 45 tablet 0   hydrochlorothiazide (HYDRODIURIL) 25 MG tablet TAKE 1 TABLET (25 MG TOTAL) BY MOUTH DAILY. 90 tablet 3   ibuprofen (ADVIL,MOTRIN) 200 MG tablet Take 600 mg by mouth every 6 (six) hours as needed for pain.     Triamcinolone Acetonide (NASACORT AQ NA) Place into the nose.     Na Sulfate-K Sulfate-Mg Sulf 17.5-3.13-1.6 GM/177ML SOLN      Current Facility-Administered Medications on File Prior to Visit  Medication Dose Route Frequency Provider Last Rate Last Admin   0.9 %  sodium chloride infusion  500 mL Intravenous Once Danis, Kirke Corin, MD  ROS:  All others reviewed and negative.  Objective        PE:  BP 118/66 (BP Location: Right Arm, Patient Position: Sitting, Cuff Size: Normal)   Pulse 60   Temp 98.7 F (37.1 C) (Oral)   Ht 5' 10"$  (1.778 m)   Wt 172 lb (78 kg)   SpO2 100%   BMI 24.68 kg/m                 Constitutional: Pt appears in NAD               HENT: Head: NCAT.                Right Ear: External ear normal.                 Left Ear: External ear normal. Bilat tm's with mild erythema.  Max sinus areas mild tender.  Pharynx with mild erythema, no exudate               Eyes: . Pupils are equal, round, and reactive to light. Conjunctivae and EOM are normal               Nose: without d/c or deformity               Neck: Neck supple. Gross normal ROM               Cardiovascular: Normal rate and regular rhythm.                  Pulmonary/Chest: Effort normal and breath sounds without rales or wheezing.                Abd:  Soft, NT, ND, + BS, no organomegaly               Neurological: Pt is alert. At baseline orientation, motor grossly intact               Skin: Skin is warm. No rashes, no other new lesions, LE edema - none               Psychiatric: Pt behavior is normal without agitation   Micro: none  Cardiac tracings I have personally interpreted today:  none  Pertinent Radiological findings (summarize): none   Lab Results  Component Value Date   WBC 8.9 04/21/2021   HGB 14.6 04/21/2021   HCT 42.6 04/21/2021   PLT 238.0 04/21/2021   GLUCOSE 93 04/21/2021   CHOL 138 04/21/2021   TRIG 54.0 04/21/2021   HDL 53.20 04/21/2021   LDLDIRECT 70.0 04/22/2020   LDLCALC 74 04/21/2021   ALT 24 04/21/2021   AST 27 04/21/2021   NA 136 04/21/2021   K 4.8 04/21/2021   CL 97 04/21/2021   CREATININE 0.93 04/21/2021   BUN 16 04/21/2021   CO2 33 (H) 04/21/2021   TSH 1.903 12/02/2013   PSA 1.38 04/21/2021   HGBA1C 5.6 04/21/2021    POCT - COVID Positive, Flu - neg, RSV - neg  Assessment/Plan:  [name] is a 67 y.o. White or Caucasian [1] male with  has a past medical history of Allergy, Arthritis, Asthma, Chronic kidney disease, GERD (gastroesophageal reflux disease), Gout, Hyperlipidemia, Hypertension, and Kidney stones.  COVID-19 virus infection Mild to mod, for paxlovid course, cough med prn,  to f/u any worsening symptoms or concerns  HLD (hyperlipidemia) Pt also to hold lipitor while taking paxlovid  HTN (hypertension) BP Readings from Last 3 Encounters:  04/20/22 118/66  09/29/21 132/84  04/21/21 124/84   Stable, pt to continue medical treatment lotensin 5 qd, hct 25 qd  Followup: Return if symptoms worsen or fail to improve.  Cathlean Cower, MD 04/22/2022 7:49 PM [facility]

## 2022-04-20 NOTE — Patient Instructions (Addendum)
Please take all new medication as prescribed  - the paxlovid and cough medicine as needed  Please HOLD the lipitor while taking the paxlovid  Please self isolate for 5 days  Please continue all other medications as before, and refills have been done if requested.  Please have the pharmacy call with any other refills you may need.  Please keep your appointments with your specialists as you may have planned

## 2022-04-22 ENCOUNTER — Encounter: Payer: Self-pay | Admitting: Family Medicine

## 2022-04-22 ENCOUNTER — Encounter: Payer: Self-pay | Admitting: Internal Medicine

## 2022-04-22 DIAGNOSIS — E785 Hyperlipidemia, unspecified: Secondary | ICD-10-CM | POA: Insufficient documentation

## 2022-04-22 NOTE — Progress Notes (Unsigned)
[facility] [address] [place], [place] [address] [phone]  Date:  04/24/2022   Name:  Joshua [name]   DOB:  [birth year]   MRN:  [id number]  PCP:  Darreld Mclean, MD    Chief Complaint: No chief complaint on file.   History of Present Illness:  Joshua [name] is a 67 y.o. very pleasant male patient who presents with the following:  Patient seen today for physical exam Most recent visit with myself about 1 year ago History of hypertension, dyslipidemia, gout He rarely has gout sx with his allopurinol He enjoys long distance running, he met his current wife [name] through a local running group after his first wife passed away He has 1 grandson who is 21 years old now  He also was seen by Dr. Willaim Bane on 2/8 with COVID-treated with Paxlovid He also had COVID over the summer and Christmas a year ago  Needs pneumonia vaccine but may want to defer due to recent COVID-19 Flu shot is up-to-date Tetanus booster  Most recent blood work 1 year ago, can update today Colonoscopy 2019 Can offer CT coronary calcium  Lipitor 40 Benazepril 2.5 HCTZ 25 Colchicine Patient Active Problem List   Diagnosis Date Noted   COVID-19 virus infection 04/20/2022   Acute pain of right knee 09/30/2018   Lumbar back pain 02/18/2018   Acute non-recurrent frontal sinusitis 04/17/2017   Chronic neck pain 09/06/2014   Other and unspecified hyperlipidemia 12/02/2013   HTN (hypertension) 12/02/2013   Gout 12/02/2013   Meralgia paresthetica of right side 12/25/2012   Seasonal allergic rhinitis 07/20/2012   History of shingles 06/11/2008    Past Medical History:  Diagnosis Date   Allergy    Arthritis    Asthma    Chronic kidney disease    GERD (gastroesophageal reflux disease)    Gout    Hyperlipidemia    Hypertension    Kidney stones     Past Surgical History:  Procedure Laterality Date   kidney stones      LITHOTRIPSY      Social History   Tobacco Use   Smoking status: Never   Smokeless tobacco: Never  Substance Use Topics   Alcohol use: Yes    Comment: Occasional   Drug use: No    Family History  Problem Relation Age of Onset   Hyperlipidemia Mother    Diabetes Mother    Pancreatic cancer Mother    Alcohol abuse Father    Alcohol abuse Brother    Drug abuse Brother    Alcohol abuse Maternal Grandmother    Hypertension Maternal Grandfather    Hypertension Paternal Grandfather    Cancer Sister    Cancer Paternal Aunt    Colon cancer Paternal Aunt    Esophageal cancer Neg Hx    Liver cancer Neg Hx    Rectal cancer Neg Hx    Stomach cancer Neg Hx     Allergies  Allergen Reactions   Aspirin Anaphylaxis   Shrimp [Shellfish Allergy] Itching and Other (See Comments)    Almond cherry Shrimp causes stomach upset, tingling of tongue and throat sometimes   Benadryl [Diphenhydramine Hcl] Rash    Allergic to the topical only   Other Itching    almonds    Medication list has been reviewed and updated.  Current Outpatient Medications on File Prior to Visit  Medication Sig Dispense Refill   acetaminophen (TYLENOL) 500  MG tablet Take 500 mg by mouth every 6 (six) hours as needed.     allopurinol (ZYLOPRIM) 300 MG tablet Take 1 tablet (300 mg total) by mouth daily. 90 tablet 1   atorvastatin (LIPITOR) 40 MG tablet TAKE 1 TABLET BY MOUTH EVERY DAY 90 tablet 3   benazepril (LOTENSIN) 5 MG tablet Take 0.5 tablets (2.5 mg total) by mouth daily. 45 tablet 3   colchicine 0.6 MG tablet Take 2 pills at first sign of gout, then take 1 pil an hour later 45 tablet 0   hydrochlorothiazide (HYDRODIURIL) 25 MG tablet TAKE 1 TABLET (25 MG TOTAL) BY MOUTH DAILY. 90 tablet 3   HYDROcodone bit-homatropine (HYCODAN) 5-1.5 MG/5ML syrup Take 5 mLs by mouth every 6 (six) hours as needed for up to 10 days. 180 mL 0   ibuprofen (ADVIL,MOTRIN) 200 MG tablet Take 600 mg by mouth every 6 (six) hours as  needed for pain.     Na Sulfate-K Sulfate-Mg Sulf 17.5-3.13-1.6 GM/177ML SOLN      nirmatrelvir/ritonavir (PAXLOVID) 20 x 150 MG & 10 x 100MG TABS Take 3 tablets by mouth 2 (two) times daily for 5 days. (Take nirmatrelvir 150 mg two tablets twice daily for 5 days and ritonavir 100 mg one tablet twice daily for 5 days) Patient GFR is 85 30 tablet 0   Triamcinolone Acetonide (NASACORT AQ NA) Place into the nose.     Current Facility-Administered Medications on File Prior to Visit  Medication Dose Route Frequency Provider Last Rate Last Admin   0.9 %  sodium chloride infusion  500 mL Intravenous Once Danis, Estill Cotta III, MD        Review of Systems:  As per HPI- otherwise negative.   Physical Examination: There were no vitals filed for this visit. There were no vitals filed for this visit. There is no height or weight on file to calculate BMI. Ideal Body Weight:    GEN: no acute distress. HEENT: Atraumatic, Normocephalic.  Ears and Nose: No external deformity. CV: RRR, No M/G/R. No JVD. No thrill. No extra heart sounds. PULM: CTA B, no wheezes, crackles, rhonchi. No retractions. No resp. distress. No accessory muscle use. ABD: S, NT, ND, +BS. No rebound. No HSM. EXTR: No c/c/e PSYCH: Normally interactive. Conversant.    Assessment and Plan: *** Physical exam today.  Encouraged healthy diet and exercise routine Will plan further follow- up pending labs.  Signed Lamar Blinks, MD

## 2022-04-22 NOTE — Assessment & Plan Note (Signed)
BP Readings from Last 3 Encounters:  04/20/22 118/66  09/29/21 132/84  04/21/21 124/84   Stable, pt to continue medical treatment lotensin 5 qd, hct 25 qd

## 2022-04-22 NOTE — Patient Instructions (Signed)
It was great to see you again today, I will be in touch with your labs Stop by imaging to set up CT coronary scan   You are due for a pneumonia -this can be done at your convenience once you are well at my office or at your pharmacy  Tetanus booster today   Best of luck with your upcoming races

## 2022-04-22 NOTE — Assessment & Plan Note (Signed)
Pt also to hold lipitor while taking paxlovid

## 2022-04-22 NOTE — Assessment & Plan Note (Signed)
Mild to mod, for paxlovid course, cough med prn,  to f/u any worsening symptoms or concerns

## 2022-04-24 ENCOUNTER — Ambulatory Visit (INDEPENDENT_AMBULATORY_CARE_PROVIDER_SITE_OTHER): Payer: 59 | Admitting: Family Medicine

## 2022-04-24 ENCOUNTER — Encounter: Payer: Self-pay | Admitting: Family Medicine

## 2022-04-24 VITALS — BP 126/78 | HR 61 | Temp 97.4°F | Resp 18 | Ht 70.0 in | Wt 170.2 lb

## 2022-04-24 DIAGNOSIS — Z131 Encounter for screening for diabetes mellitus: Secondary | ICD-10-CM | POA: Diagnosis not present

## 2022-04-24 DIAGNOSIS — Z0000 Encounter for general adult medical examination without abnormal findings: Secondary | ICD-10-CM | POA: Diagnosis not present

## 2022-04-24 DIAGNOSIS — Z125 Encounter for screening for malignant neoplasm of prostate: Secondary | ICD-10-CM

## 2022-04-24 DIAGNOSIS — Z23 Encounter for immunization: Secondary | ICD-10-CM

## 2022-04-24 DIAGNOSIS — Z130 Encounter for screening for diseases of the blood and blood-forming organs and certain disorders involving the immune mechanism: Secondary | ICD-10-CM

## 2022-04-24 DIAGNOSIS — I10 Essential (primary) hypertension: Secondary | ICD-10-CM

## 2022-04-24 DIAGNOSIS — Z13220 Encounter for screening for lipoid disorders: Secondary | ICD-10-CM

## 2022-04-24 DIAGNOSIS — M109 Gout, unspecified: Secondary | ICD-10-CM

## 2022-04-24 MED ORDER — BENAZEPRIL HCL 5 MG PO TABS
2.5000 mg | ORAL_TABLET | Freq: Every day | ORAL | 3 refills | Status: DC
Start: 2022-04-24 — End: 2022-05-22

## 2022-04-24 MED ORDER — ALLOPURINOL 300 MG PO TABS
300.0000 mg | ORAL_TABLET | Freq: Every day | ORAL | 3 refills | Status: DC
Start: 2022-04-24 — End: 2022-10-03

## 2022-04-25 ENCOUNTER — Ambulatory Visit (HOSPITAL_BASED_OUTPATIENT_CLINIC_OR_DEPARTMENT_OTHER)
Admission: RE | Admit: 2022-04-25 | Discharge: 2022-04-25 | Disposition: A | Discharge status: Home / Self Care | Payer: 59 | Source: Ambulatory Visit | Attending: Family Medicine | Admitting: Family Medicine

## 2022-04-25 ENCOUNTER — Encounter: Payer: Self-pay | Admitting: Family Medicine

## 2022-04-25 DIAGNOSIS — Z13220 Encounter for screening for lipoid disorders: Secondary | ICD-10-CM | POA: Insufficient documentation

## 2022-04-25 DIAGNOSIS — I10 Essential (primary) hypertension: Secondary | ICD-10-CM

## 2022-04-25 DIAGNOSIS — R931 Abnormal findings on diagnostic imaging of heart and coronary circulation: Secondary | ICD-10-CM

## 2022-05-01 ENCOUNTER — Other Ambulatory Visit (INDEPENDENT_AMBULATORY_CARE_PROVIDER_SITE_OTHER): Payer: 59

## 2022-05-01 DIAGNOSIS — Z131 Encounter for screening for diabetes mellitus: Secondary | ICD-10-CM

## 2022-05-01 DIAGNOSIS — Z130 Encounter for screening for diseases of the blood and blood-forming organs and certain disorders involving the immune mechanism: Secondary | ICD-10-CM

## 2022-05-01 DIAGNOSIS — Z13220 Encounter for screening for lipoid disorders: Secondary | ICD-10-CM | POA: Diagnosis not present

## 2022-05-01 DIAGNOSIS — Z125 Encounter for screening for malignant neoplasm of prostate: Secondary | ICD-10-CM | POA: Diagnosis not present

## 2022-05-01 NOTE — Addendum Note (Signed)
Addended by: Kelle Darting A on: 05/01/2022 01:22 PM   Modules accepted: Orders

## 2022-05-02 ENCOUNTER — Encounter: Payer: Self-pay | Admitting: Family Medicine

## 2022-05-02 LAB — CBC
HCT: 43.1 % (ref 39.0–52.0)
Hemoglobin: 14.8 g/dL (ref 13.0–17.0)
MCHC: 34.2 g/dL (ref 30.0–36.0)
MCV: 86.3 fl (ref 78.0–100.0)
Platelets: 259 10*3/uL (ref 150.0–400.0)
RBC: 5 Mil/uL (ref 4.22–5.81)
RDW: 13.8 % (ref 11.5–15.5)
WBC: 9.6 10*3/uL (ref 4.0–10.5)

## 2022-05-02 LAB — COMPREHENSIVE METABOLIC PANEL
ALT: 24 U/L (ref 0–53)
AST: 22 U/L (ref 0–37)
Albumin: 4.4 g/dL (ref 3.5–5.2)
Alkaline Phosphatase: 94 U/L (ref 39–117)
BUN: 14 mg/dL (ref 6–23)
CO2: 28 mEq/L (ref 19–32)
Calcium: 9.7 mg/dL (ref 8.4–10.5)
Chloride: 99 mEq/L (ref 96–112)
Creatinine, Ser: 0.88 mg/dL (ref 0.40–1.50)
GFR: 90.44 mL/min (ref >60.00)
Glucose, Bld: 88 mg/dL (ref 70–99)
Potassium: 4.5 mEq/L (ref 3.5–5.1)
Sodium: 135 mEq/L (ref 135–145)
Total Bilirubin: 0.4 mg/dL (ref 0.2–1.2)
Total Protein: 6.7 g/dL (ref 6.0–8.3)

## 2022-05-02 LAB — LIPID PANEL
Cholesterol: 169 mg/dL (ref 0–200)
HDL: 44 mg/dL (ref >39.00)
NonHDL: 125.17
Total CHOL/HDL Ratio: 4
Triglycerides: 292 mg/dL — ABNORMAL HIGH (ref 0.0–149.0)
VLDL: 58.4 mg/dL — ABNORMAL HIGH (ref 0.0–40.0)

## 2022-05-02 LAB — PSA: PSA: 1.18 ng/mL (ref 0.10–4.00)

## 2022-05-02 LAB — HEMOGLOBIN A1C: Hgb A1c MFr Bld: 5.8 % (ref 4.6–6.5)

## 2022-05-02 LAB — LDL CHOLESTEROL, DIRECT: Direct LDL: 96 mg/dL

## 2022-05-02 NOTE — Progress Notes (Signed)
Received labs as below, message to pt  Results for orders placed or performed in visit on 05/01/22  PSA  Result Value Ref Range   PSA 1.18 0.10 - 4.00 ng/mL  Lipid panel  Result Value Ref Range   Cholesterol 169 0 - 200 mg/dL   Triglycerides 292.0 (H) 0.0 - 149.0 mg/dL   HDL 44.00 >39.00 mg/dL   VLDL 58.4 (H) 0.0 - 40.0 mg/dL   Total CHOL/HDL Ratio 4    NonHDL 125.17   Hemoglobin A1c  Result Value Ref Range   Hgb A1c MFr Bld 5.8 4.6 - 6.5 %  Comprehensive metabolic panel  Result Value Ref Range   Sodium 135 135 - 145 mEq/L   Potassium 4.5 3.5 - 5.1 mEq/L   Chloride 99 96 - 112 mEq/L   CO2 28 19 - 32 mEq/L   Glucose, Bld 88 70 - 99 mg/dL   BUN 14 6 - 23 mg/dL   Creatinine, Ser 0.88 0.40 - 1.50 mg/dL   Total Bilirubin 0.4 0.2 - 1.2 mg/dL   Alkaline Phosphatase 94 39 - 117 U/L   AST 22 0 - 37 U/L   ALT 24 0 - 53 U/L   Total Protein 6.7 6.0 - 8.3 g/dL   Albumin 4.4 3.5 - 5.2 g/dL   GFR 90.44 >60.00 mL/min   Calcium 9.7 8.4 - 10.5 mg/dL  CBC  Result Value Ref Range   WBC 9.6 4.0 - 10.5 K/uL   RBC 5.00 4.22 - 5.81 Mil/uL   Platelets 259.0 150.0 - 400.0 K/uL   Hemoglobin 14.8 13.0 - 17.0 g/dL   HCT 43.1 39.0 - 52.0 %   MCV 86.3 78.0 - 100.0 fl   MCHC 34.2 30.0 - 36.0 g/dL   RDW 13.8 11.5 - 15.5 %  LDL cholesterol, direct  Result Value Ref Range   Direct LDL 96.0 mg/dL

## 2022-05-11 ENCOUNTER — Encounter: Payer: Self-pay | Admitting: Cardiology

## 2022-05-11 ENCOUNTER — Ambulatory Visit: Payer: 59 | Attending: Cardiology | Admitting: Cardiology

## 2022-05-11 VITALS — BP 140/90 | HR 76 | Ht 70.0 in | Wt 172.2 lb

## 2022-05-11 DIAGNOSIS — I2510 Atherosclerotic heart disease of native coronary artery without angina pectoris: Secondary | ICD-10-CM

## 2022-05-11 DIAGNOSIS — E785 Hyperlipidemia, unspecified: Secondary | ICD-10-CM

## 2022-05-11 DIAGNOSIS — I10 Essential (primary) hypertension: Secondary | ICD-10-CM | POA: Diagnosis not present

## 2022-05-11 MED ORDER — ATORVASTATIN CALCIUM 80 MG PO TABS: 80.0000 mg | ORAL_TABLET | Freq: Every day | ORAL | 3 refills | Status: DC

## 2022-05-11 NOTE — Patient Instructions (Addendum)
Medication Instructions:  INCREASE atorvastatin (Lipitor) to 80 mg daily  *If you need a refill on your cardiac medications before your next appointment, please call your pharmacy*  Lab Work: Please return for FASTING labs in 2 months (Lipid)  Our in office lab hours are Monday-Friday 8:00-4:00, closed for lunch 12:45-1:45 pm.  No appointment needed.  [facility] locations:   [place] ([place]) - [id number] [place] [address] [place] [address] - [address] [place] - [address] ([facility])  Follow-Up: At [facility], you and your health needs are our priority.  As part of our continuing mission to provide you with exceptional heart care, we have created designated Provider Care Teams.  These Care Teams include your primary Cardiologist (physician) and Advanced Practice Providers (APPs -  Physician Assistants and Nurse Practitioners) who all work together to provide you with the care you need, when you need it.  We recommend signing up for the patient portal called "MyChart".  Sign up information is provided on this After Visit Summary.  MyChart is used to connect with patients for Virtual Visits (Telemedicine).  Patients are able to view lab/test results, encounter notes, upcoming appointments, etc.  Non-urgent messages can be sent to your provider as well.   To learn more about what you can do with MyChart, go to [email or web address].    Your next appointment:   6 month(s)  Provider:   Dr. Gardiner Rhyme   Other Instructions Please check your blood pressure at home twice daily, write it down.  Call the office or send message via Mychart with the readings in 1 week for Dr. Gardiner Rhyme to review.     Recommend Omron upper arm blood pressure cuff

## 2022-05-11 NOTE — Progress Notes (Signed)
Cardiology Office Note:    Date:  05/11/2022   ID:  Wylene Simmer, DOB 06/29/1955, MRN [id number]  PCP:  Darreld Mclean, MD  Cardiologist:  None  Electrophysiologist:  None   Referring MD: Darreld Mclean, MD   Chief Complaint  Patient presents with   Coronary Artery Disease    History of Present Illness:    Joshua [name] is a 67 y.o. male with a hx of CAD, hypertension, hyperlipidemia, asthma who is referred by Dr Lorelei Pont for evaluation of elevated calcium score.  Calcium score on 04/25/22 was 211 (96th percentile).  Denies any chest pain, dyspnea, lightheadedness, syncope, or palpitations.  Did report some shortness of breath after recent COVID episode but states that he currently feels recovered.  Recently ran 26 mile race in [place], denied any exertional symptoms.  Does report occasional swelling in legs.  No smoking history.  Family history includes paternal grandfather died of MI in 79s, maternal grandfather died of MI in 64s, and brother had MI at 80.    BP Readings from Last 3 Encounters:  05/11/22 (!) 140/90  04/24/22 126/78  04/20/22 118/66     Past Medical History:  Diagnosis Date   Allergy    Arthritis    Asthma    Chronic kidney disease    GERD (gastroesophageal reflux disease)    Gout    Hyperlipidemia    Hypertension    Kidney stones     Past Surgical History:  Procedure Laterality Date   kidney stones     LITHOTRIPSY      Current Medications: Current Meds  Medication Sig   acetaminophen (TYLENOL) 500 MG tablet Take 500 mg by mouth every 6 (six) hours as needed.   allopurinol (ZYLOPRIM) 300 MG tablet Take 1 tablet (300 mg total) by mouth daily.   benazepril (LOTENSIN) 5 MG tablet Take 0.5 tablets (2.5 mg total) by mouth daily.   colchicine 0.6 MG tablet Take 2 pills at first sign of gout, then take 1 pil an hour later   hydrochlorothiazide (HYDRODIURIL) 25 MG tablet TAKE 1 TABLET (25 MG TOTAL) BY MOUTH DAILY.   ibuprofen  (ADVIL,MOTRIN) 200 MG tablet Take 600 mg by mouth every 6 (six) hours as needed for pain.   Na Sulfate-K Sulfate-Mg Sulf 17.5-3.13-1.6 GM/177ML SOLN    Triamcinolone Acetonide (NASACORT AQ NA) Place into the nose.   [DISCONTINUED] atorvastatin (LIPITOR) 40 MG tablet TAKE 1 TABLET BY MOUTH EVERY DAY   Current Facility-Administered Medications for the 05/11/22 encounter (Office Visit) with Donato Heinz, MD  Medication   0.9 %  sodium chloride infusion     Allergies:   Aspirin, Shrimp [shellfish allergy], Benadryl [diphenhydramine hcl], and Other   Social History   Socioeconomic History   Marital status: Married    Spouse name: Not on file   Number of children: Not on file   Years of education: Not on file   Highest education level: Not on file  Occupational History   Not on file  Tobacco Use   Smoking status: Never   Smokeless tobacco: Never  Substance and Sexual Activity   Alcohol use: Yes    Comment: Occasional   Drug use: No   Sexual activity: Yes    Birth control/protection: None  Other Topics Concern   Not on file  Social History Narrative   Not on file   Social Determinants of Health   Financial Resource Strain: Not on file  Food Insecurity: Not on file  Transportation Needs: Not on file  Physical Activity: Not on file  Stress: Not on file  Social Connections: Not on file     Family History: The patient's family history includes Alcohol abuse in his brother, father, and maternal grandmother; Cancer in his paternal aunt and sister; Colon cancer in his paternal aunt; Diabetes in his mother; Drug abuse in his brother; Hyperlipidemia in his mother; Hypertension in his maternal grandfather and paternal grandfather; Pancreatic cancer in his mother. There is no history of Esophageal cancer, Liver cancer, Rectal cancer, or Stomach cancer.  ROS:   Please see the history of present illness.     All other systems reviewed and are negative.  EKGs/Labs/Other  Studies Reviewed:    The following studies were reviewed today:   EKG:   05/11/2022: Normal sinus rhythm, rate 76, no ST abnormality  Recent Labs: 05/01/2022: ALT 24; BUN 14; Creatinine, Ser 0.88; Hemoglobin 14.8; Platelets 259.0; Potassium 4.5; Sodium 135  Recent Lipid Panel    Component Value Date/Time   CHOL 169 05/01/2022 1323   TRIG 292.0 (H) 05/01/2022 1323   HDL 44.00 05/01/2022 1323   CHOLHDL 4 05/01/2022 1323   VLDL 58.4 (H) 05/01/2022 1323   LDLCALC 74 04/21/2021 0914   LDLDIRECT 96.0 05/01/2022 1323    Physical Exam:    VS:  BP (!) 140/90   Pulse 76   Ht '5\' 10"'$  (1.778 m)   Wt 172 lb 3.2 oz (78.1 kg)   SpO2 97%   BMI 24.71 kg/m     Wt Readings from Last 3 Encounters:  05/11/22 172 lb 3.2 oz (78.1 kg)  04/24/22 170 lb 3.2 oz (77.2 kg)  04/20/22 172 lb (78 kg)     GEN: For well nourished, well developed in no acute distress HEENT: Normal NECK: No JVD; No carotid bruits LYMPHATICS: No lymphadenopathy CARDIAC: RRR, no murmurs, rubs, gallops RESPIRATORY:  Clear to auscultation without rales, wheezing or rhonchi  ABDOMEN: Soft, non-tender, non-distended MUSCULOSKELETAL:  No edema; No deformity  SKIN: Warm and dry NEUROLOGIC:  Alert and oriented x 3 PSYCHIATRIC:  Normal affect   ASSESSMENT:    1. CAD in native artery   2. Hyperlipidemia, unspecified hyperlipidemia type   3. Essential hypertension    PLAN:    CAD: Calcium score on 04/25/22 was 211 (96th percentile).  Denies any anginal symptoms -Recommend increasing atorvastatin to 80 mg daily for goal LDL less than 70  Hypertension: on benazepril 2.5 mg daily and HCTZ 25 mg daily.  BP elevated in clinic but reports has been under good control.  Recommend checking BP twice daily for next week and calling with results  Hyperlipidemia: on atorvastatin 40 mg daily.  LDL 96 on 05/01/22.  Increase atorvastatin to 80 mg daily for goal LDL less than 70.  Check fasting lipid panel in 2 months  RTC in 6  months   Medication Adjustments/Labs and Tests Ordered: Current medicines are reviewed at length with the patient today.  Concerns regarding medicines are outlined above.  Orders Placed This Encounter  Procedures   Lipid panel   EKG 12-Lead   Meds ordered this encounter  Medications   atorvastatin (LIPITOR) 80 MG tablet    Sig: Take 1 tablet (80 mg total) by mouth daily.    Dispense:  90 tablet    Refill:  3    Patient Instructions  Medication Instructions:  INCREASE atorvastatin (Lipitor) to 80 mg daily  *If you need a refill on your cardiac medications before  your next appointment, please call your pharmacy*  Lab Work: Please return for FASTING labs in 2 months (Lipid)  Our in office lab hours are Monday-Friday 8:00-4:00, closed for lunch 12:45-1:45 pm.  No appointment needed.  [facility] locations:   [place] ([place]) - [id number] [place] [address] [place] [address] - [address] [place] - [address] ([facility])  Follow-Up: At [facility], you and your health needs are our priority.  As part of our continuing mission to provide you with exceptional heart care, we have created designated Provider Care Teams.  These Care Teams include your primary Cardiologist (physician) and Advanced Practice Providers (APPs -  Physician Assistants and Nurse Practitioners) who all work together to provide you with the care you need, when you need it.  We recommend signing up for the patient portal called "MyChart".  Sign up information is provided on this After Visit Summary.  MyChart is used to connect with patients for Virtual Visits (Telemedicine).  Patients are able to view lab/test results,  encounter notes, upcoming appointments, etc.  Non-urgent messages can be sent to your provider as well.   To learn more about what you can do with MyChart, go to [email or web address].    Your next appointment:   6 month(s)  Provider:   Dr. Gardiner Rhyme   Other Instructions Please check your blood pressure at home twice daily, write it down.  Call the office or send message via Mychart with the readings in 1 week for Dr. Gardiner Rhyme to review.    Recommend Omron upper arm blood pressure cuff    Signed, Donato Heinz, MD  05/11/2022 4:30 PM    [place]

## 2022-05-18 ENCOUNTER — Encounter: Payer: Self-pay | Admitting: Cardiology

## 2022-05-18 DIAGNOSIS — I10 Essential (primary) hypertension: Secondary | ICD-10-CM

## 2022-05-22 MED ORDER — BENAZEPRIL HCL 5 MG PO TABS: 5.0000 mg | ORAL_TABLET | Freq: Every day | ORAL | 3 refills | Status: DC

## 2022-05-22 NOTE — Telephone Encounter (Signed)
Recommend increasing benazepril to '5mg'$  daily and check BMET in 1 week.  He is OK to do his run

## 2022-05-29 NOTE — Addendum Note (Signed)
Addended by: Patria Mane A on: 05/29/2022 08:40 AM   Modules accepted: Orders

## 2022-05-31 LAB — BASIC METABOLIC PANEL
BUN/Creatinine Ratio: 10 (ref 10–24)
BUN: 9 mg/dL (ref 8–27)
CO2: 24 mmol/L (ref 20–29)
Calcium: 9.8 mg/dL (ref 8.6–10.2)
Chloride: 101 mmol/L (ref 96–106)
Creatinine, Ser: 0.94 mg/dL (ref 0.76–1.27)
Glucose: 92 mg/dL (ref 70–99)
Potassium: 5.2 mmol/L (ref 3.5–5.2)
Sodium: 139 mmol/L (ref 134–144)
eGFR: 90 mL/min/{1.73_m2} (ref >59)

## 2022-06-05 NOTE — Telephone Encounter (Signed)
Blood pressure looks okay, would not recommend any changes at this time

## 2022-06-12 NOTE — Telephone Encounter (Signed)
BP looks okay, does not need to continue to send in blood pressure

## 2022-06-26 ENCOUNTER — Telehealth: Payer: Self-pay | Admitting: Cardiology

## 2022-06-26 ENCOUNTER — Other Ambulatory Visit: Payer: Self-pay

## 2022-06-26 DIAGNOSIS — I10 Essential (primary) hypertension: Secondary | ICD-10-CM

## 2022-06-26 MED ORDER — BENAZEPRIL HCL 5 MG PO TABS: 5.0000 mg | ORAL_TABLET | Freq: Every day | ORAL | 3 refills | Status: DC

## 2022-06-26 NOTE — Telephone Encounter (Signed)
*  STAT* If patient is at the pharmacy, call can be transferred to refill team.   1. Which medications need to be refilled? (please list name of each medication and dose if known)   benazepril (LOTENSIN) 5 MG tablet   2. Which pharmacy/location (including street and city if local pharmacy) is medication to be sent to?  [id number] - [place], [place] - [address]   3. Do they need a 30 day or 90 day supply?   90 day   Patient stated his medication was changed from 1/2 tablet a day to 1 whole tablet a day and he is now running out of this medication.

## 2022-06-26 NOTE — Telephone Encounter (Signed)
Medication has been refilled and sent to his local pharmacy.  

## 2022-07-28 ENCOUNTER — Encounter: Payer: Self-pay | Admitting: Cardiology

## 2022-07-28 ENCOUNTER — Telehealth: Payer: Self-pay

## 2022-07-28 DIAGNOSIS — E785 Hyperlipidemia, unspecified: Secondary | ICD-10-CM

## 2022-07-28 DIAGNOSIS — I499 Cardiac arrhythmia, unspecified: Secondary | ICD-10-CM

## 2022-07-28 NOTE — Telephone Encounter (Signed)
Patient to have repeat Lipid panel 2 months from February OV.  Lab not ordered.  Lab call for this  as patient in lab now. Lab ordered and released so patient will have drawn now.

## 2022-07-29 LAB — LIPID PANEL
Chol/HDL Ratio: 2.8 ratio (ref 0.0–5.0)
Cholesterol, Total: 176 mg/dL (ref 100–199)
HDL: 64 mg/dL (ref >39)
LDL Chol Calc (NIH): 99 mg/dL (ref 0–99)
Triglycerides: 67 mg/dL (ref 0–149)
VLDL Cholesterol Cal: 13 mg/dL (ref 5–40)

## 2022-07-31 ENCOUNTER — Other Ambulatory Visit: Payer: Self-pay | Admitting: *Deleted

## 2022-07-31 ENCOUNTER — Ambulatory Visit: Payer: 59 | Attending: Cardiology

## 2022-07-31 DIAGNOSIS — I499 Cardiac arrhythmia, unspecified: Secondary | ICD-10-CM

## 2022-07-31 DIAGNOSIS — E785 Hyperlipidemia, unspecified: Secondary | ICD-10-CM

## 2022-07-31 MED ORDER — EZETIMIBE 10 MG PO TABS: 10.0000 mg | ORAL_TABLET | Freq: Every day | ORAL | 3 refills | Status: DC

## 2022-07-31 NOTE — Telephone Encounter (Signed)
Recommend ZIO x 2 weeks

## 2022-07-31 NOTE — Progress Notes (Unsigned)
Enrolled for [employer] to mail a ZIO XT long term holter monitor to the patients address on file.  

## 2022-08-02 DIAGNOSIS — I499 Cardiac arrhythmia, unspecified: Secondary | ICD-10-CM

## 2022-10-02 ENCOUNTER — Encounter: Payer: Self-pay | Admitting: Cardiology

## 2022-10-02 ENCOUNTER — Telehealth: Payer: Self-pay | Admitting: Cardiology

## 2022-10-02 NOTE — Telephone Encounter (Signed)
Spoke with patient and he is going out of country and needed a letter from from provider with his prescriptions. He also sent mychart message that was forwarded to provider as well.

## 2022-10-02 NOTE — Telephone Encounter (Signed)
Patient wants to get copies of his prescriptions as he will be travelling out of the country tomorrow and wants to pick up the hard copy list today.

## 2022-10-03 ENCOUNTER — Other Ambulatory Visit: Payer: Self-pay

## 2022-10-03 DIAGNOSIS — I10 Essential (primary) hypertension: Secondary | ICD-10-CM

## 2022-10-03 DIAGNOSIS — M109 Gout, unspecified: Secondary | ICD-10-CM

## 2022-10-03 MED ORDER — BENAZEPRIL HCL 5 MG PO TABS
5.0000 mg | ORAL_TABLET | Freq: Every day | ORAL | 0 refills | Status: DC
Start: 2022-10-03 — End: 2022-10-10

## 2022-10-03 MED ORDER — HYDROCHLOROTHIAZIDE 25 MG PO TABS
25.0000 mg | ORAL_TABLET | Freq: Every day | ORAL | 0 refills | Status: DC
Start: 2022-10-03 — End: 2023-02-14

## 2022-10-03 MED ORDER — ALLOPURINOL 300 MG PO TABS
300.0000 mg | ORAL_TABLET | Freq: Every day | ORAL | 0 refills | Status: DC
Start: 2022-10-03 — End: 2023-05-14

## 2022-10-03 MED ORDER — EZETIMIBE 10 MG PO TABS: 10.0000 mg | ORAL_TABLET | Freq: Every day | ORAL | 0 refills | Status: DC

## 2022-10-03 MED ORDER — ATORVASTATIN CALCIUM 80 MG PO TABS: 80.0000 mg | ORAL_TABLET | Freq: Every day | ORAL | 0 refills | Status: DC

## 2022-10-03 NOTE — Telephone Encounter (Signed)
Spoke to patient written prescriptions left at front desk for pick up.

## 2022-10-03 NOTE — Telephone Encounter (Signed)
Yes that is fine we can give him a copy of his prescriptions

## 2022-10-03 NOTE — Telephone Encounter (Signed)
Yes we can give him written copies of prescriptions if he can come by today

## 2022-10-04 NOTE — Telephone Encounter (Signed)
Spoke to patient yesterday written prescriptions left at front desk.

## 2022-10-10 ENCOUNTER — Other Ambulatory Visit: Payer: Self-pay

## 2022-10-10 DIAGNOSIS — I10 Essential (primary) hypertension: Secondary | ICD-10-CM

## 2022-10-10 MED ORDER — BENAZEPRIL HCL 5 MG PO TABS
5.0000 mg | ORAL_TABLET | Freq: Every day | ORAL | 3 refills | Status: DC
Start: 2022-10-10 — End: 2023-08-13

## 2022-11-17 ENCOUNTER — Ambulatory Visit: Payer: 59 | Admitting: Cardiology

## 2022-12-15 ENCOUNTER — Ambulatory Visit: Payer: 59 | Attending: Cardiology | Admitting: Cardiology

## 2022-12-15 ENCOUNTER — Encounter: Payer: Self-pay | Admitting: Cardiology

## 2022-12-15 VITALS — BP 122/66 | HR 52 | Ht 70.0 in | Wt 173.0 lb

## 2022-12-15 DIAGNOSIS — I10 Essential (primary) hypertension: Secondary | ICD-10-CM | POA: Diagnosis not present

## 2022-12-15 DIAGNOSIS — I2510 Atherosclerotic heart disease of native coronary artery without angina pectoris: Secondary | ICD-10-CM | POA: Diagnosis not present

## 2022-12-15 DIAGNOSIS — I493 Ventricular premature depolarization: Secondary | ICD-10-CM | POA: Diagnosis not present

## 2022-12-15 DIAGNOSIS — E785 Hyperlipidemia, unspecified: Secondary | ICD-10-CM | POA: Diagnosis not present

## 2022-12-15 NOTE — Progress Notes (Signed)
Cardiology Office Note:    Date:  12/24/2022   ID:  Joshua [name], DOB 05/26/1955, MRN [id number]  PCP:  Pearline Cables, MD  Cardiologist:  None  Electrophysiologist:  None   Referring MD: Pearline Cables, MD   Chief Complaint  Patient presents with   Coronary Artery Disease    History of Present Illness:    Joshua [name] is a 67 y.o. male with a hx of CAD, hypertension, hyperlipidemia, asthma who presents for follow-up.  He was referred by Dr Patsy Lager for evaluation of elevated calcium score, initially seen 05/11/2022.  Calcium score on 04/25/22 was 211 (96th percentile).  Denies any chest pain, dyspnea, lightheadedness, syncope, or palpitations.  Did report some shortness of breath after recent COVID episode but states that he currently feels recovered.  [place] 20 [place], denied any exertional symptoms.  Does report occasional swelling in legs.  No smoking history.  Family history includes paternal grandfather died of MI in 37s, maternal grandfather died of MI in 52s, and brother had MI at 19.  Zio patch x 7 days 07/2022 showed occasional PVCs (1.4% of beats).  Since last clinic visit, he reports he is doing well. Denies any chest pain, dyspnea, lightheadedness, syncope, lower extremity edema, or palpitations.  He remains very active, had 31 mile race this weekend.    BP Readings from Last 3 Encounters:  12/15/22 122/66  05/11/22 (!) 140/90  04/24/22 126/78     Past Medical History:  Diagnosis Date   Allergy    Arthritis    Asthma    Chronic kidney disease    GERD (gastroesophageal reflux disease)    Gout    Hyperlipidemia    Hypertension    Kidney stones     Past Surgical History:  Procedure Laterality Date   kidney stones     LITHOTRIPSY      Current Medications: Current Meds  Medication Sig   acetaminophen (TYLENOL) 500 MG tablet Take 500 mg by mouth every 6 (six) hours as needed.   allopurinol (ZYLOPRIM) 300 MG tablet Take 1  tablet (300 mg total) by mouth daily.   atorvastatin (LIPITOR) 80 MG tablet Take 1 tablet (80 mg total) by mouth daily.   benazepril (LOTENSIN) 5 MG tablet Take 1 tablet (5 mg total) by mouth daily.   colchicine 0.6 MG tablet Take 2 pills at first sign of gout, then take 1 pil an hour later   ezetimibe (ZETIA) 10 MG tablet Take 1 tablet (10 mg total) by mouth daily.   hydrochlorothiazide (HYDRODIURIL) 25 MG tablet Take 1 tablet (25 mg total) by mouth daily.   ibuprofen (ADVIL,MOTRIN) 200 MG tablet Take 600 mg by mouth every 6 (six) hours as needed for pain.   Na Sulfate-K Sulfate-Mg Sulf 17.5-3.13-1.6 GM/177ML SOLN    Triamcinolone Acetonide (NASACORT AQ NA) Place into the nose.   Current Facility-Administered Medications for the 12/15/22 encounter (Office Visit) with Little Ishikawa, MD  Medication   0.9 %  sodium chloride infusion     Allergies:   Aspirin, Shrimp [shellfish allergy], Benadryl [diphenhydramine hcl], and Other   Social History   Socioeconomic History   Marital status: Married    Spouse name: Not on file   Number of children: Not on file   Years of education: Not on file   Highest education level: Not on file  Occupational History   Not on file  Tobacco Use   Smoking status: Never   Smokeless  tobacco: Never  Substance and Sexual Activity   Alcohol use: Yes    Comment: Occasional   Drug use: No   Sexual activity: Yes    Birth control/protection: None  Other Topics Concern   Not on file  Social History Narrative   Not on file   Social Determinants of Health   Financial Resource Strain: Not on file  Food Insecurity: Not on file  Transportation Needs: Not on file  Physical Activity: Not on file  Stress: Not on file  Social Connections: Not on file     Family History: The patient's family history includes Alcohol abuse in his brother, father, and maternal grandmother; Cancer in his paternal aunt and sister; Colon cancer in his paternal aunt;  Diabetes in his mother; Drug abuse in his brother; Hyperlipidemia in his mother; Hypertension in his maternal grandfather and paternal grandfather; Pancreatic cancer in his mother. There is no history of Esophageal cancer, Liver cancer, Rectal cancer, or Stomach cancer.  ROS:   Please see the history of present illness.     All other systems reviewed and are negative.  EKGs/Labs/Other Studies Reviewed:    The following studies were reviewed today:   EKG:   05/11/2022: Normal sinus rhythm, rate 76, no ST abnormality 12/15/2022: Sinus bradycardia, rate 52, LVH  Recent Labs: 05/01/2022: ALT 24; Hemoglobin 14.8; Platelets 259.0 05/30/2022: BUN 9; Creatinine, Ser 0.94; Potassium 5.2; Sodium 139  Recent Lipid Panel    Component Value Date/Time   CHOL 176 07/28/2022 1041   TRIG 67 07/28/2022 1041   HDL 64 07/28/2022 1041   CHOLHDL 2.8 07/28/2022 1041   CHOLHDL 4 05/01/2022 1323   VLDL 58.4 (H) 05/01/2022 1323   LDLCALC 99 07/28/2022 1041   LDLDIRECT 96.0 05/01/2022 1323    Physical Exam:    VS:  BP 122/66   Pulse (!) 52   Ht 5\' 10"  (1.778 m)   Wt 173 lb (78.5 kg)   SpO2 97%   BMI 24.82 kg/m     Wt Readings from Last 3 Encounters:  12/15/22 173 lb (78.5 kg)  05/11/22 172 lb 3.2 oz (78.1 kg)  04/24/22 170 lb 3.2 oz (77.2 kg)     GEN: For well nourished, well developed in no acute distress HEENT: Normal NECK: No JVD; No carotid bruits LYMPHATICS: No lymphadenopathy CARDIAC: RRR, no murmurs, rubs, gallops RESPIRATORY:  Clear to auscultation without rales, wheezing or rhonchi  ABDOMEN: Soft, non-tender, non-distended MUSCULOSKELETAL:  No edema; No deformity  SKIN: Warm and dry NEUROLOGIC:  Alert and oriented x 3 PSYCHIATRIC:  Normal affect   ASSESSMENT:    1. CAD in native artery   2. PVC's (premature ventricular contractions)   3. Essential hypertension   4. Hyperlipidemia, unspecified hyperlipidemia type     PLAN:    CAD: Calcium score on 04/25/22 was 211 (96th  percentile).  Denies any anginal symptoms -Continue atorvastatin 80 mg daily and Zetia 10 mg daily  Palpitations: Zio patch x 7 days 07/2022 showed occasional PVCs (1.4% of beats).  Check echocardiogram to evaluate for structural heart disease  Hypertension: on benazepril 5 mg daily and HCTZ 25 mg daily.  Appears well controlled.   Hyperlipidemia: on atorvastatin 80 mg daily, LDL 99 on 07/28/2022.  Zetia 10 mg daily added.  Check lipid panel  RTC in 1 year   Medication Adjustments/Labs and Tests Ordered: Current medicines are reviewed at length with the patient today.  Concerns regarding medicines are outlined above.  Orders Placed This Encounter  Procedures   Lipid panel   EKG 12-Lead   ECHOCARDIOGRAM COMPLETE   No orders of the defined types were placed in this encounter.   Patient Instructions  Medication Instructions:  No Changes *If you need a refill on your cardiac medications before your next appointment, please call your pharmacy*   Lab Work: Lipid Panel 1-2 weeks If you have labs (blood work) drawn today and your tests are completely normal, you will receive your results only by: MyChart Message (if you have MyChart) OR A paper copy in the mail If you have any lab test that is abnormal or we need to change your treatment, we will call you to review the results.   Testing/Procedures: [address] Your physician has requested that you have an echocardiogram. Echocardiography is a painless test that uses sound waves to create images of your heart. It provides your doctor with information about the size and shape of your heart and how well your heart's chambers and valves are working. This procedure takes approximately one hour. There are no restrictions for this procedure. Please do NOT wear cologne, perfume, aftershave, or lotions (deodorant is allowed). Please arrive 15 minutes prior to your appointment time.    Follow-Up: At [facility], you and your health needs are our priority.  As part of our continuing mission to provide you with exceptional heart care, we have created designated Provider Care Teams.  These Care Teams include your primary Cardiologist (physician) and Advanced Practice Providers (APPs -  Physician Assistants and Nurse Practitioners) who all work together to provide you with the care you need, when you need it.  We recommend signing up for the patient portal called "MyChart".  Sign up information is provided on this After Visit Summary.  MyChart is used to connect with patients for Virtual Visits (Telemedicine).  Patients are able to view lab/test results, encounter notes, upcoming appointments, etc.  Non-urgent messages can be sent to your provider as well.   To learn more about what you can do with MyChart, go to [email or web address].    Your next appointment:   1 year(s)  Provider:   Epifanio Lesches, MD       Signed, Little Ishikawa, MD  12/24/2022 10:29 PM    [facility]

## 2022-12-15 NOTE — Patient Instructions (Signed)
Medication Instructions:  No Changes *If you need a refill on your cardiac medications before your next appointment, please call your pharmacy*   Lab Work: Lipid Panel 1-2 weeks If you have labs (blood work) drawn today and your tests are completely normal, you will receive your results only by: MyChart Message (if you have MyChart) OR A paper copy in the mail If you have any lab test that is abnormal or we need to change your treatment, we will call you to review the results.   Testing/Procedures: [address] Your physician has requested that you have an echocardiogram. Echocardiography is a painless test that uses sound waves to create images of your heart. It provides your doctor with information about the size and shape of your heart and how well your heart's chambers and valves are working. This procedure takes approximately one hour. There are no restrictions for this procedure. Please do NOT wear cologne, perfume, aftershave, or lotions (deodorant is allowed). Please arrive 15 minutes prior to your appointment time.    Follow-Up: At [facility], you and your health needs are our priority.  As part of our continuing mission to provide you with exceptional heart care, we have created designated Provider Care Teams.  These Care Teams include your primary Cardiologist (physician) and Advanced Practice Providers (APPs -  Physician Assistants and Nurse Practitioners) who all work together to provide you with the care you need, when you need it.  We recommend signing up for the patient portal called "MyChart".  Sign up information is provided on this After Visit Summary.  MyChart is used to connect with patients for Virtual Visits (Telemedicine).  Patients are able to view lab/test results, encounter notes, upcoming appointments, etc.  Non-urgent messages can be sent to your provider as well.   To learn more about what you can do with MyChart, go to  [email or web address].    Your next appointment:   1 year(s)  Provider:   Epifanio Lesches, MD

## 2022-12-26 ENCOUNTER — Other Ambulatory Visit: Payer: Self-pay

## 2022-12-26 DIAGNOSIS — E785 Hyperlipidemia, unspecified: Secondary | ICD-10-CM

## 2022-12-27 LAB — LIPID PANEL
Chol/HDL Ratio: 2.2 ratio (ref 0.0–5.0)
Cholesterol, Total: 123 mg/dL (ref 100–199)
HDL: 55 mg/dL (ref >39)
LDL Chol Calc (NIH): 54 mg/dL (ref 0–99)
Triglycerides: 70 mg/dL (ref 0–149)
VLDL Cholesterol Cal: 14 mg/dL (ref 5–40)

## 2023-01-01 ENCOUNTER — Ambulatory Visit (HOSPITAL_COMMUNITY): Payer: 59 | Attending: Internal Medicine

## 2023-01-01 DIAGNOSIS — I493 Ventricular premature depolarization: Secondary | ICD-10-CM | POA: Insufficient documentation

## 2023-01-01 DIAGNOSIS — I10 Essential (primary) hypertension: Secondary | ICD-10-CM | POA: Diagnosis present

## 2023-01-01 LAB — ECHOCARDIOGRAM COMPLETE
Area-P 1/2: 2.83 cm2
MV M vel: 5.07 m/s
MV Peak grad: 102.8 mm[Hg]
Radius: 0.4 cm
S' Lateral: 2.7 cm

## 2023-02-14 ENCOUNTER — Other Ambulatory Visit: Payer: Self-pay | Admitting: Family Medicine

## 2023-02-14 DIAGNOSIS — I10 Essential (primary) hypertension: Secondary | ICD-10-CM

## 2023-03-16 ENCOUNTER — Telehealth: Payer: 59 | Admitting: Family Medicine

## 2023-03-16 DIAGNOSIS — B9689 Other specified bacterial agents as the cause of diseases classified elsewhere: Secondary | ICD-10-CM

## 2023-03-16 DIAGNOSIS — J019 Acute sinusitis, unspecified: Secondary | ICD-10-CM | POA: Diagnosis not present

## 2023-03-16 MED ORDER — AMOXICILLIN-POT CLAVULANATE 875-125 MG PO TABS: 1.0000 | ORAL_TABLET | Freq: Two times a day (BID) | ORAL | 0 refills | Status: DC

## 2023-03-16 NOTE — Patient Instructions (Signed)

## 2023-03-16 NOTE — Progress Notes (Signed)
 Virtual Visit Consent   Joshua [name], you are scheduled for a virtual visit with a [facility] provider today. Just as with appointments in the office, your consent must be obtained to participate. Your consent will be active for this visit and any virtual visit you may have with one of our providers in the next 365 days. If you have a MyChart account, a copy of this consent can be sent to you electronically.  As this is a virtual visit, video technology does not allow for your provider to perform a traditional examination. This may limit your provider's ability to fully assess your condition. If your provider identifies any concerns that need to be evaluated in person or the need to arrange testing (such as labs, EKG, etc.), we will make arrangements to do so. Although advances in technology are sophisticated, we cannot ensure that it will always work on either your end or our end. If the connection with a video visit is poor, the visit may have to be switched to a telephone visit. With either a video or telephone visit, we are not always able to ensure that we have a secure connection.  By engaging in this virtual visit, you consent to the provision of healthcare and authorize for your insurance to be billed (if applicable) for the services provided during this visit. Depending on your insurance coverage, you may receive a charge related to this service.  I need to obtain your verbal consent now. Are you willing to proceed with your visit today? Joshua [name] has provided verbal consent on 03/16/2023 for a virtual visit (video or telephone). Joshua [name]  Date: 03/16/2023 4:33 PM  Virtual Visit via Video Note   I, Joshua [name], connected with  Joshua [name]  ([id number], [birth year]) on 03/16/23 at  4:30 PM EST by a video-enabled telemedicine application and verified that I am speaking with the correct person using two identifiers.  Location: Patient: Virtual Visit Location Patient:  Home Provider: Virtual Visit Location Provider: Home Office   I discussed the limitations of evaluation and management by telemedicine and the availability of in person appointments. The patient expressed understanding and agreed to proceed.    History of Present Illness: Joshua [name] is a 68 y.o. who identifies as a male who was assigned male at birth, and is being seen today for sinus pressure and pain with post nasal drainage and green mucus for 5-6 days worsening. No fever. No cough wheezing or sob. [name]  HPI: HPI  Problems:  Patient Active Problem List   Diagnosis Date Noted   HLD (hyperlipidemia) 04/22/2022   COVID-19 virus infection 04/20/2022   Acute pain of right knee 09/30/2018   Lumbar back pain 02/18/2018   Acute non-recurrent frontal sinusitis 04/17/2017   Chronic neck pain 09/06/2014   Other and unspecified hyperlipidemia 12/02/2013   HTN (hypertension) 12/02/2013   Gout 12/02/2013   Meralgia paresthetica of right side 12/25/2012   Seasonal allergic rhinitis 07/20/2012   History of shingles 06/11/2008    Allergies:  Allergies  Allergen Reactions   Aspirin Anaphylaxis   Shrimp [Shellfish Allergy] Itching and Other (See Comments)    Almond cherry Shrimp causes stomach upset, tingling of tongue and throat sometimes   Benadryl [Diphenhydramine Hcl] Rash    Allergic to the topical only   Other Itching    almonds   Medications:  Current Outpatient Medications:    amoxicillin -clavulanate (AUGMENTIN ) 875-125 MG tablet, Take 1 tablet by mouth 2 (two) times daily., Disp: 20  tablet, Rfl: 0   acetaminophen  (TYLENOL ) 500 MG tablet, Take 500 mg by mouth every 6 (six) hours as needed., Disp: , Rfl:    allopurinol  (ZYLOPRIM ) 300 MG tablet, Take 1 tablet (300 mg total) by mouth daily., Disp: 30 tablet, Rfl: 0   atorvastatin  (LIPITOR) 80 MG tablet, Take 1 tablet (80 mg total) by mouth daily., Disp: 30 tablet, Rfl: 0   benazepril  (LOTENSIN ) 5 MG tablet, Take 1 tablet (5 mg  total) by mouth daily., Disp: 90 tablet, Rfl: 3   colchicine  0.6 MG tablet, Take 2 pills at first sign of gout, then take 1 pil an hour later, Disp: 45 tablet, Rfl: 0   ezetimibe  (ZETIA ) 10 MG tablet, Take 1 tablet (10 mg total) by mouth daily., Disp: 30 tablet, Rfl: 0   hydrochlorothiazide  (HYDRODIURIL ) 25 MG tablet, TAKE 1 TABLET (25 MG TOTAL) BY MOUTH DAILY., Disp: 90 tablet, Rfl: 3   ibuprofen (ADVIL,MOTRIN) 200 MG tablet, Take 600 mg by mouth every 6 (six) hours as needed for pain., Disp: , Rfl:    Na Sulfate-K Sulfate-Mg Sulf 17.5-3.13-1.6 GM/177ML SOLN, , Disp: , Rfl:    Triamcinolone Acetonide (NASACORT AQ NA), Place into the nose., Disp: , Rfl:   Current Facility-Administered Medications:    0.9 %  sodium chloride  infusion, 500 mL, Intravenous, Once, Danis, Victory CROME III, MD  Observations/Objective: Patient is well-developed, well-nourished in no acute distress.  Resting comfortably  at home.  Head is normocephalic, atraumatic.  No labored breathing.  Speech is clear and coherent with logical content.  Patient is alert and oriented at baseline.    Assessment and Plan: 1. Acute bacterial sinusitis (Primary)  Increase fluids, humidifier at night, tylenol  or ibuprofen as directed, UC if sx worsen.   Follow Up Instructions: I discussed the assessment and treatment plan with the patient. The patient was provided an opportunity to ask questions and all were answered. The patient agreed with the plan and demonstrated an understanding of the instructions.  A copy of instructions were sent to the patient via MyChart unless otherwise noted below.     The patient was advised to call back or seek an in-person evaluation if the symptoms worsen or if the condition fails to improve as anticipated.    Joshua Aramburo, [name]

## 2023-04-26 ENCOUNTER — Telehealth: Payer: 59 | Admitting: Physician Assistant

## 2023-04-26 DIAGNOSIS — B9689 Other specified bacterial agents as the cause of diseases classified elsewhere: Secondary | ICD-10-CM

## 2023-04-26 DIAGNOSIS — J019 Acute sinusitis, unspecified: Secondary | ICD-10-CM

## 2023-04-26 MED ORDER — AMOXICILLIN-POT CLAVULANATE 875-125 MG PO TABS: 1.0000 | ORAL_TABLET | Freq: Two times a day (BID) | ORAL | 0 refills | Status: DC

## 2023-04-26 NOTE — Progress Notes (Signed)

## 2023-04-26 NOTE — Progress Notes (Signed)
I have spent 5 minutes in review of e-visit questionnaire, review and updating patient chart, medical decision making and response to patient.   Piedad Climes, PA-C

## 2023-05-03 ENCOUNTER — Encounter: Payer: 59 | Admitting: Family Medicine

## 2023-05-06 ENCOUNTER — Other Ambulatory Visit: Payer: Self-pay | Admitting: Cardiology

## 2023-05-12 ENCOUNTER — Other Ambulatory Visit: Payer: Self-pay | Admitting: Family Medicine

## 2023-05-12 DIAGNOSIS — M109 Gout, unspecified: Secondary | ICD-10-CM

## 2023-05-14 NOTE — Patient Instructions (Addendum)
 Good to see you again today!   I will be in touch with your labs Pneumonia vaccine today  I will reach out to your cardiologist about your concerns with low pulse at night- I think ok but will run it by him

## 2023-05-14 NOTE — Progress Notes (Signed)
 [facility] [address], [address] [place], [place] [address] [phone]  Date:  05/21/2023   Name:  Joshua [name]   DOB:  Aug 17, 1955   MRN:  [id number]  PCP:  Pearline Cables, MD    Chief Complaint: Annual Exam (Concerns/ questions:  1. HR is dropping during the night. 2.recent URI's VV 03/16/23: Bacterial Sinusitis- Augmentin/Flu: Fall 2024/Pna20 due)   History of Present Illness:  Joshua [name] is a 68 y.o. very pleasant male patient who presents with the following:  Patient seen today for physical exam.  He also notes several recent illnesses Most recent visit with myself was about 1 year ago  History of hypertension, dyslipidemia, gout He rarely has gout sx with his allopurinol- he is not having any sx  He enjoys long distance running, he met his current wife [name] through a local running group after his first wife passed away He has 1 grandson who is 49 years old now-he lives in [place] They keep their 2.5 yo granddaughter a few nights a week as well ; this is a lot of fun!   We unexpectedly found a very high coronary calcium last year-in the interim he established care with cardiology.  Most recent visit with his cardiologist was in October He completed a Zio patch which was reassuring Echo obtained in October 2024-no significant abnormalities He is on medication for mild hypertension as well as dyslipidemia Pt noes his heart rate may run quite low- down to the 30s- while he is asleep at night His heart rates will go to 160s when he is running hard- jogging about 140 BPM He did note a episode of tachycardia to about 100 while at rest- this went away  He continues to run long distances on a regular basis with no problems  Lipid profile was checked in October-due for other routine blood work today  Shingrix done  Needs pneumonia vaccine-we will get today Flu shot - done  No recent gout flareup  Patient Active  Problem List   Diagnosis Date Noted   HLD (hyperlipidemia) 04/22/2022   COVID-19 virus infection 04/20/2022   Acute pain of right knee 09/30/2018   Lumbar back pain 02/18/2018   Acute non-recurrent frontal sinusitis 04/17/2017   Chronic neck pain 09/06/2014   Other and unspecified hyperlipidemia 12/02/2013   HTN (hypertension) 12/02/2013   Gout 12/02/2013   Meralgia paresthetica of right side 12/25/2012   Seasonal allergic rhinitis 07/20/2012   History of shingles 06/11/2008    Past Medical History:  Diagnosis Date   Allergy    Arthritis    Asthma    Chronic kidney disease    GERD (gastroesophageal reflux disease)    Gout    Hyperlipidemia    Hypertension    Kidney stones     Past Surgical History:  Procedure Laterality Date   kidney stones     LITHOTRIPSY      Social History   Tobacco Use   Smoking status: Never   Smokeless tobacco: Never  Substance Use Topics   Alcohol use: Yes    Comment: Occasional   Drug use: No    Family History  Problem Relation Age of Onset   Hyperlipidemia Mother    Diabetes Mother    Pancreatic cancer Mother    Alcohol abuse Father    Alcohol abuse Brother    Drug abuse Brother    Alcohol abuse Maternal Grandmother  Hypertension Maternal Grandfather    Hypertension Paternal Grandfather    Cancer Sister    Cancer Paternal Aunt    Colon cancer Paternal Aunt    Esophageal cancer Neg Hx    Liver cancer Neg Hx    Rectal cancer Neg Hx    Stomach cancer Neg Hx     Allergies  Allergen Reactions   Aspirin Anaphylaxis   Shrimp [Shellfish Allergy] Itching and Other (See Comments)    Almond cherry Shrimp causes stomach upset, tingling of tongue and throat sometimes   Benadryl [Diphenhydramine Hcl] Rash    Allergic to the topical only   Other Itching    almonds    Medication list has been reviewed and updated.  Current Outpatient Medications on File Prior to Visit  Medication Sig Dispense Refill   acetaminophen  (TYLENOL) 500 MG tablet Take 500 mg by mouth every 6 (six) hours as needed.     allopurinol (ZYLOPRIM) 300 MG tablet Take 1 tablet (300 mg total) by mouth daily. 90 tablet 1   atorvastatin (LIPITOR) 80 MG tablet TAKE 1 TABLET BY MOUTH EVERY DAY 90 tablet 2   benazepril (LOTENSIN) 5 MG tablet Take 1 tablet (5 mg total) by mouth daily. 90 tablet 3   colchicine 0.6 MG tablet Take 2 pills at first sign of gout, then take 1 pil an hour later 45 tablet 0   ezetimibe (ZETIA) 10 MG tablet TAKE 1 TABLET BY MOUTH EVERY DAY 90 tablet 2   hydrochlorothiazide (HYDRODIURIL) 25 MG tablet TAKE 1 TABLET (25 MG TOTAL) BY MOUTH DAILY. 90 tablet 3   ibuprofen (ADVIL,MOTRIN) 200 MG tablet Take 600 mg by mouth every 6 (six) hours as needed for pain.     Triamcinolone Acetonide (NASACORT AQ NA) Place into the nose.     Current Facility-Administered Medications on File Prior to Visit  Medication Dose Route Frequency Provider Last Rate Last Admin   0.9 %  sodium chloride infusion  500 mL Intravenous Once Danis, Starr Lake III, MD        Review of Systems:  As per HPI- otherwise negative.    Physical Examination: Vitals:   05/21/23 0940  BP: (!) 140/80  Pulse: 60  Resp: 18  Temp: 97.8 F (36.6 C)  SpO2: 98%   Vitals:   05/21/23 0940  Weight: 170 lb 6.4 oz (77.3 kg)  Height: 5\' 10"  (1.778 m)   Body mass index is 24.45 kg/m. Ideal Body Weight: Weight in (lb) to have BMI = 25: 173.9  GEN: no acute distress. Normal weight, looks well  HEENT: Atraumatic, Normocephalic.  Bilateral TM wnl, oropharynx normal.  PEERL,EOMI.   Ears and Nose: No external deformity. CV: RRR, No M/G/R. No JVD. No thrill. No extra heart sounds. PULM: CTA B, no wheezes, crackles, rhonchi. No retractions. No resp. distress. No accessory muscle use. ABD: S, NT, ND, +BS. No rebound. No HSM. EXTR: No c/c/e PSYCH: Normally interactive. Conversant.    Assessment and Plan: Physical exam  Screening for diabetes mellitus - Plan:  Comprehensive metabolic panel, Hemoglobin A1c  Essential hypertension - Plan: CBC, Comprehensive metabolic panel  Gout, unspecified cause, unspecified chronicity, unspecified site  Special screening, prostate cancer - Plan: PSA  Bradycardia - Plan: TSH  Immunization due - Plan: Pneumococcal conjugate vaccine 20-valent (Prevnar-20)  Physical exam today- encouraged continued healthy diet and exercise routine  Updated pneumonia vaccine today Lab work is pending as above I will touch base with his cardiologist about concern of nocturnal  bradycardia Signed Abbe Amsterdam, MD   Received labs as below- message to pt  Results for orders placed or performed in visit on 05/21/23  CBC   Collection Time: 05/21/23 10:18 AM  Result Value Ref Range   WBC 5.9 4.0 - 10.5 K/uL   RBC 4.97 4.22 - 5.81 Mil/uL   Platelets 206.0 150.0 - 400.0 K/uL   Hemoglobin 14.9 13.0 - 17.0 g/dL   HCT 57.8 46.9 - 62.9 %   MCV 87.8 78.0 - 100.0 fl   MCHC 34.2 30.0 - 36.0 g/dL   RDW 52.8 41.3 - 24.4 %  Comprehensive metabolic panel   Collection Time: 05/21/23 10:18 AM  Result Value Ref Range   Sodium 139 135 - 145 mEq/L   Potassium 4.1 3.5 - 5.1 mEq/L   Chloride 103 96 - 112 mEq/L   CO2 28 19 - 32 mEq/L   Glucose, Bld 94 70 - 99 mg/dL   BUN 10 6 - 23 mg/dL   Creatinine, Ser 0.10 0.40 - 1.50 mg/dL   Total Bilirubin 1.0 0.2 - 1.2 mg/dL   Alkaline Phosphatase 109 39 - 117 U/L   AST 42 (H) 0 - 37 U/L   ALT 35 0 - 53 U/L   Total Protein 6.8 6.0 - 8.3 g/dL   Albumin 4.7 3.5 - 5.2 g/dL   GFR 27.25 >36.64 mL/min   Calcium 9.7 8.4 - 10.5 mg/dL  Hemoglobin Q0H   Collection Time: 05/21/23 10:18 AM  Result Value Ref Range   Hgb A1c MFr Bld 6.1 4.6 - 6.5 %  PSA   Collection Time: 05/21/23 10:18 AM  Result Value Ref Range   PSA 1.44 0.10 - 4.00 ng/mL  TSH   Collection Time: 05/21/23 10:18 AM  Result Value Ref Range   TSH 2.48 0.35 - 5.50 uIU/mL

## 2023-05-15 ENCOUNTER — Other Ambulatory Visit: Payer: Self-pay | Admitting: Cardiology

## 2023-05-21 ENCOUNTER — Encounter: Payer: Self-pay | Admitting: Family Medicine

## 2023-05-21 ENCOUNTER — Ambulatory Visit (INDEPENDENT_AMBULATORY_CARE_PROVIDER_SITE_OTHER): Admitting: Family Medicine

## 2023-05-21 VITALS — BP 140/80 | HR 60 | Temp 97.8°F | Resp 18 | Ht 70.0 in | Wt 170.4 lb

## 2023-05-21 DIAGNOSIS — I10 Essential (primary) hypertension: Secondary | ICD-10-CM | POA: Diagnosis not present

## 2023-05-21 DIAGNOSIS — R001 Bradycardia, unspecified: Secondary | ICD-10-CM | POA: Diagnosis not present

## 2023-05-21 DIAGNOSIS — Z0000 Encounter for general adult medical examination without abnormal findings: Secondary | ICD-10-CM | POA: Diagnosis not present

## 2023-05-21 DIAGNOSIS — Z131 Encounter for screening for diabetes mellitus: Secondary | ICD-10-CM

## 2023-05-21 DIAGNOSIS — Z125 Encounter for screening for malignant neoplasm of prostate: Secondary | ICD-10-CM | POA: Diagnosis not present

## 2023-05-21 DIAGNOSIS — M109 Gout, unspecified: Secondary | ICD-10-CM

## 2023-05-21 DIAGNOSIS — Z23 Encounter for immunization: Secondary | ICD-10-CM

## 2023-05-21 DIAGNOSIS — R945 Abnormal results of liver function studies: Secondary | ICD-10-CM

## 2023-05-21 LAB — COMPREHENSIVE METABOLIC PANEL
ALT: 35 U/L (ref 0–53)
AST: 42 U/L — ABNORMAL HIGH (ref 0–37)
Albumin: 4.7 g/dL (ref 3.5–5.2)
Alkaline Phosphatase: 109 U/L (ref 39–117)
BUN: 10 mg/dL (ref 6–23)
CO2: 28 meq/L (ref 19–32)
Calcium: 9.7 mg/dL (ref 8.4–10.5)
Chloride: 103 meq/L (ref 96–112)
Creatinine, Ser: 0.86 mg/dL (ref 0.40–1.50)
GFR: 90.4 mL/min (ref >60.00)
Glucose, Bld: 94 mg/dL (ref 70–99)
Potassium: 4.1 meq/L (ref 3.5–5.1)
Sodium: 139 meq/L (ref 135–145)
Total Bilirubin: 1 mg/dL (ref 0.2–1.2)
Total Protein: 6.8 g/dL (ref 6.0–8.3)

## 2023-05-21 LAB — CBC
HCT: 43.7 % (ref 39.0–52.0)
Hemoglobin: 14.9 g/dL (ref 13.0–17.0)
MCHC: 34.2 g/dL (ref 30.0–36.0)
MCV: 87.8 fl (ref 78.0–100.0)
Platelets: 206 10*3/uL (ref 150.0–400.0)
RBC: 4.97 Mil/uL (ref 4.22–5.81)
RDW: 13.3 % (ref 11.5–15.5)
WBC: 5.9 10*3/uL (ref 4.0–10.5)

## 2023-05-21 LAB — HEMOGLOBIN A1C: Hgb A1c MFr Bld: 6.1 % (ref 4.6–6.5)

## 2023-05-21 LAB — TSH: TSH: 2.48 u[IU]/mL (ref 0.35–5.50)

## 2023-05-21 LAB — PSA: PSA: 1.44 ng/mL (ref 0.10–4.00)

## 2023-05-22 ENCOUNTER — Encounter: Payer: Self-pay | Admitting: Family Medicine

## 2023-08-07 ENCOUNTER — Telehealth: Admitting: Family Medicine

## 2023-08-07 ENCOUNTER — Ambulatory Visit (HOSPITAL_COMMUNITY)
Admission: EM | Admit: 2023-08-07 | Discharge: 2023-08-07 | Disposition: A | Discharge status: Home / Self Care | Attending: Emergency Medicine | Admitting: Emergency Medicine

## 2023-08-07 ENCOUNTER — Encounter (HOSPITAL_COMMUNITY): Payer: Self-pay

## 2023-08-07 DIAGNOSIS — J069 Acute upper respiratory infection, unspecified: Secondary | ICD-10-CM

## 2023-08-07 MED ORDER — BENZONATATE 100 MG PO CAPS: 100.0000 mg | ORAL_CAPSULE | Freq: Three times a day (TID) | ORAL | 0 refills | Status: DC | PRN

## 2023-08-07 NOTE — ED Triage Notes (Signed)
 Pt c/o cough, sinus pressure, and nasal congestion x1wk. States hx of sinus infections and feels the same.

## 2023-08-07 NOTE — Progress Notes (Signed)
  Because you have had 2 other sinus infections this year- we need to have you seen in person for this one. Your condition warrants further evaluation and I recommend that you be seen in a face-to-face visit. Additionally, it is not in the time frame for starting antibiotics, it might be more of a viral infection starting which can overlap with symptoms of a bacterial one.    NOTE: There will be NO CHARGE for this E-Visit   If you are having a true medical emergency, please call 911.

## 2023-08-07 NOTE — ED Provider Notes (Signed)
 [facility]    CSN: [id number] Arrival date & time: 08/07/23  1043      History   Chief Complaint Chief Complaint  Patient presents with   Cough    HPI Joshua [name] is a 68 y.o. male.  Last week had sore throat 2 days ago started with nasal congestion and cough. Yesterday cough was productive. Low grade fever this morning 99.2 Took guaifenesin DM this morning Possible sick contacts No recent travel   Past Medical History:  Diagnosis Date   Allergy    Arthritis    Asthma    Chronic kidney disease    GERD (gastroesophageal reflux disease)    Gout    Hyperlipidemia    Hypertension    Kidney stones     Patient Active Problem List   Diagnosis Date Noted   HLD (hyperlipidemia) 04/22/2022   COVID-19 virus infection 04/20/2022   Acute pain of right knee 09/30/2018   Lumbar back pain 02/18/2018   Acute non-recurrent frontal sinusitis 04/17/2017   Chronic neck pain 09/06/2014   Other and unspecified hyperlipidemia 12/02/2013   HTN (hypertension) 12/02/2013   Gout 12/02/2013   Meralgia paresthetica of right side 12/25/2012   Seasonal allergic rhinitis 07/20/2012   History of shingles 06/11/2008    Past Surgical History:  Procedure Laterality Date   kidney stones     LITHOTRIPSY         Home Medications    Prior to Admission medications   Medication Sig Start Date End Date Taking? Authorizing Provider  benzonatate  (TESSALON ) 100 MG capsule Take 1 capsule (100 mg total) by mouth 3 (three) times daily as needed for cough. 08/07/23  Yes Nevah Dalal, Ivette Marks, PA-C  acetaminophen  (TYLENOL ) 500 MG tablet Take 500 mg by mouth every 6 (six) hours as needed.    [provider]  allopurinol  (ZYLOPRIM ) 300 MG tablet Take 1 tablet (300 mg total) by mouth daily. 05/14/23   Copland, Skipper Dumas, MD  atorvastatin  (LIPITOR) 80 MG tablet TAKE 1 TABLET BY MOUTH EVERY DAY 05/07/23   Wendie Hamburg, MD  benazepril  (LOTENSIN ) 5 MG tablet Take 1 tablet (5 mg  total) by mouth daily. 10/10/22   Wendie Hamburg, MD  colchicine  0.6 MG tablet Take 2 pills at first sign of gout, then take 1 pil an hour later 05/18/17   Copland, Jessica C, MD  ezetimibe  (ZETIA ) 10 MG tablet TAKE 1 TABLET BY MOUTH EVERY DAY 05/17/23   Wendie Hamburg, MD  hydrochlorothiazide  (HYDRODIURIL ) 25 MG tablet TAKE 1 TABLET (25 MG TOTAL) BY MOUTH DAILY. 02/14/23   Copland, Jessica C, MD  ibuprofen (ADVIL,MOTRIN) 200 MG tablet Take 600 mg by mouth every 6 (six) hours as needed for pain.    [provider]  Triamcinolone Acetonide (NASACORT AQ NA) Place into the nose.    [provider]    Family History Family History  Problem Relation Age of Onset   Hyperlipidemia Mother    Diabetes Mother    Pancreatic cancer Mother    Alcohol abuse Father    Alcohol abuse Brother    Drug abuse Brother    Alcohol abuse Maternal Grandmother    Hypertension Maternal Grandfather    Hypertension Paternal Grandfather    Cancer Sister    Cancer Paternal Aunt    Colon cancer Paternal Aunt    Esophageal cancer Neg Hx    Liver cancer Neg Hx    Rectal cancer Neg Hx    Stomach cancer Neg  Hx     Social History Social History   Tobacco Use   Smoking status: Never   Smokeless tobacco: Never  Substance Use Topics   Alcohol use: Yes    Comment: Occasional   Drug use: No     Allergies   Aspirin, Shrimp [shellfish allergy], Benadryl [diphenhydramine hcl], and Other   Review of Systems Review of Systems  Respiratory:  Positive for cough.    As per HPI  Physical Exam Triage Vital Signs ED Triage Vitals [08/07/23 1141]  Encounter Vitals Group     BP 137/82     Systolic BP Percentile      Diastolic BP Percentile      Pulse Rate 62     Resp 18     Temp 98.2 F (36.8 C)     Temp Source Oral     SpO2 97 %     Weight      Height      Head Circumference      Peak Flow      Pain Score 2     Pain Loc      Pain Education      Exclude from Growth Chart     No data found.  Updated Vital Signs BP 137/82 (BP Location: Right Arm)   Pulse 62   Temp 98.2 F (36.8 C) (Oral)   Resp 18   SpO2 97%    Physical Exam Vitals and nursing note reviewed.  Constitutional:      General: He is not in acute distress.    Appearance: He is not ill-appearing or diaphoretic.  HENT:     Right Ear: Tympanic membrane and ear canal normal.     Left Ear: Tympanic membrane and ear canal normal.     Nose: Congestion present. No rhinorrhea.     Mouth/Throat:     Mouth: Mucous membranes are moist.     Pharynx: Oropharynx is clear. No posterior oropharyngeal erythema.  Eyes:     Conjunctiva/sclera: Conjunctivae normal.  Cardiovascular:     Rate and Rhythm: Normal rate and regular rhythm.     Pulses: Normal pulses.     Heart sounds: Normal heart sounds.  Pulmonary:     Effort: Pulmonary effort is normal. No respiratory distress.     Breath sounds: Normal breath sounds. No wheezing or rales.  Musculoskeletal:     Cervical back: Normal range of motion.  Lymphadenopathy:     Cervical: No cervical adenopathy.  Skin:    General: Skin is warm and dry.  Neurological:     Mental Status: He is alert and oriented to person, place, and time.     UC Treatments / Results  Labs (all labs ordered are listed, but only abnormal results are displayed) Labs Reviewed - No data to display  EKG  Radiology No results found.  Procedures Procedures (including critical care time)  Medications Ordered in UC Medications - No data to display  Initial Impression / Assessment and Plan / UC Course  I have reviewed the triage vital signs and the nursing notes.  Pertinent labs & imaging results that were available during my care of the patient were reviewed by me and considered in my medical decision making (see chart for details).  Afebrile, well-appearing 2-day history of nasal congestion with cough.  Discussed with patient likely viral etiology at this time.  There  is no indication for antibiotics.  Discussed supportive care and OTC options.  Have sent Tessalon  to  try for cough.  Advised likely prognosis of virus.  Discussed if progressing or worsening symptoms after 7 to 10 days, can return for reevaluation.  Patient is agreeable to plan, all questions answered  Final Clinical Impressions(s) / UC Diagnoses   Final diagnoses:  Viral URI with cough     Discharge Instructions      Guaifenesin DM twice daily with lots of fluids Nasacort 2 sprays daily The tessalon cough pills can be taken 3x daily. If this medication makes you drowsy, take only one pill before bed. You can also use honey, vicks vapor rub, humidifier, nasal saline or mist  It can take a full week for symptoms to improve If after 7-10 days you have progressive worsening of symptoms, please return   ED Prescriptions     Medication Sig Dispense Auth. Provider   benzonatate (TESSALON) 100 MG capsule  (Status: Discontinued) Take 1 capsule (100 mg total) by mouth 3 (three) times daily as needed for cough. 30 capsule Laci Frenkel, PA-C   benzonatate (TESSALON) 100 MG capsule Take 1 capsule (100 mg total) by mouth 3 (three) times daily as needed for cough. 30 capsule Jackelyn Illingworth, Ivette Marks, PA-C      PDMP not reviewed this encounter.   Creighton Doffing, PA-C 08/07/23 1354

## 2023-08-07 NOTE — Discharge Instructions (Signed)
 Guaifenesin DM twice daily with lots of fluids Nasacort 2 sprays daily The tessalon cough pills can be taken 3x daily. If this medication makes you drowsy, take only one pill before bed. You can also use honey, vicks vapor rub, humidifier, nasal saline or mist  It can take a full week for symptoms to improve If after 7-10 days you have progressive worsening of symptoms, please return

## 2023-08-11 ENCOUNTER — Other Ambulatory Visit: Payer: Self-pay | Admitting: Cardiology

## 2023-08-11 DIAGNOSIS — I10 Essential (primary) hypertension: Secondary | ICD-10-CM

## 2023-11-11 ENCOUNTER — Other Ambulatory Visit: Payer: Self-pay | Admitting: Family Medicine

## 2023-11-11 DIAGNOSIS — M109 Gout, unspecified: Secondary | ICD-10-CM

## 2023-11-15 ENCOUNTER — Encounter: Payer: Self-pay | Admitting: Cardiology

## 2023-12-20 ENCOUNTER — Encounter: Payer: Self-pay | Admitting: Family Medicine

## 2023-12-22 DIAGNOSIS — R7303 Prediabetes: Secondary | ICD-10-CM | POA: Insufficient documentation

## 2023-12-22 NOTE — Patient Instructions (Incomplete)
 It was good to see you again today, I will be in touch with your labs

## 2023-12-22 NOTE — Progress Notes (Addendum)
 [facility], [facility] [employer] [address], [address] [place], [place] [address] [phone]  Date:  12/27/2023   Name:  Joshua [name]   DOB:  12-09-1955   MRN:  [id number]  PCP:  Watt Harlene BROCKS, MD    Chief Complaint: Follow-up (I've had a couple of times where I feel really SOB after running )   History of Present Illness:  Joshua [name] is a 68 y.o. very pleasant male patient who presents with the following:  Pt seen today for periodic follow-up.  I saw him most recently in March for his physical History of hypertension, dyslipidemia, gout, prediabetes In 2024 we unexpectedly found quite a high coronary calcium  score.  He has since established with cardiology At our last visit he noted bradycardia especially at night which was concerning him.  I touched base with cardiology who thought this was physiologic and not of concern  Flu shot done already  Colonoscopy is up-to-date He has completed Shingrix  and pneumonia, tetanus is up-to-date  Allopurinol  300 Lipitor 80 Zetia  10 HCTZ 25 Benazepril  5 Colchicine  as needed Lab work completed in March  Lab Results  Component Value Date   PSA 1.44 05/21/2023   PSA 1.18 05/01/2022   PSA 1.38 04/21/2021   Discussed the use of AI scribe software for clinical note transcription with the patient, who gave verbal consent to proceed.  History of Present Illness Joshua [name] is a 68 year old male with high coronary calcium  who presents with episodes of sudden shortness of breath during exercise.  He is overall well and running in long distance events like usual.  However, today he notes that he has experienced a few sporadic sudden episodes of shortness of breath while running, particularly during max exertion/speed work.  He has to stop and rest for a minute or 2 and he will get back to normal.  These episodes have occurred twice in the last 10 days, with the most recent episode  three days ago. Recovery from these episodes takes about two minutes, after which he feels fine again. No chest pain, pressure, or discomfort is associated with these episodes. He also reports a sudden loss of energy during these episodes, which resolves within a few minutes. No wheezing or difficulty exhaling, which he associates with his past asthma experience in his mid-thirties to mid-forties, a condition that has since resolved.  He has a history of high coronary calcium . He has not undergone a heart catheterization.  Heart disease does run in the family. He last saw his cardiologist about a year ago.  In terms of exercise, he is actively training and recently completed a 50K run a few weeks ago without issues. He always has a race on the schedule and was engaging in speed training when the episodes occurred.  He has no recent issues with gout and takes medication daily for it. He also uses hearing aids, which are working well for him.    Patient Active Problem List   Diagnosis Date Noted   Prediabetes 12/22/2023   HLD (hyperlipidemia) 04/22/2022   COVID-19 virus infection 04/20/2022   Acute pain of right knee 09/30/2018   Lumbar back pain 02/18/2018   Acute non-recurrent frontal sinusitis 04/17/2017   Chronic neck pain 09/06/2014   Other and unspecified hyperlipidemia 12/02/2013   HTN (hypertension) 12/02/2013   Gout 12/02/2013   Meralgia paresthetica of right side 12/25/2012   Seasonal allergic rhinitis 07/20/2012   History  of shingles 06/11/2008    Past Medical History:  Diagnosis Date   Allergy    Arthritis    Asthma    Chronic kidney disease    GERD (gastroesophageal reflux disease)    Gout    Hyperlipidemia    Hypertension    Kidney stones     Past Surgical History:  Procedure Laterality Date   kidney stones     LITHOTRIPSY      Social History   Tobacco Use   Smoking status: Never   Smokeless tobacco: Never  Substance Use Topics   Alcohol use: Yes     Comment: Occasional   Drug use: No    Family History  Problem Relation Age of Onset   Hyperlipidemia Mother    Diabetes Mother    Pancreatic cancer Mother    Alcohol abuse Father    Alcohol abuse Brother    Drug abuse Brother    Alcohol abuse Maternal Grandmother    Hypertension Maternal Grandfather    Hypertension Paternal Grandfather    Cancer Sister    Cancer Paternal Aunt    Colon cancer Paternal Aunt    Esophageal cancer Neg Hx    Liver cancer Neg Hx    Rectal cancer Neg Hx    Stomach cancer Neg Hx     Allergies  Allergen Reactions   Aspirin Anaphylaxis   Shrimp [Shellfish Allergy] Itching and Other (See Comments)    Almond cherry Shrimp causes stomach upset, tingling of tongue and throat sometimes   Benadryl [Diphenhydramine Hcl] Rash    Allergic to the topical only   Other Itching    almonds    Medication list has been reviewed and updated.  Current Outpatient Medications on File Prior to Visit  Medication Sig Dispense Refill   acetaminophen  (TYLENOL ) 500 MG tablet Take 500 mg by mouth every 6 (six) hours as needed.     allopurinol  (ZYLOPRIM ) 300 MG tablet TAKE 1 TABLET BY MOUTH EVERY DAY 90 tablet 1   atorvastatin  (LIPITOR) 80 MG tablet TAKE 1 TABLET BY MOUTH EVERY DAY 90 tablet 2   benazepril  (LOTENSIN ) 5 MG tablet TAKE 1 TABLET (5 MG TOTAL) BY MOUTH DAILY. 90 tablet 1   colchicine  0.6 MG tablet Take 2 pills at first sign of gout, then take 1 pil an hour later 45 tablet 0   ezetimibe  (ZETIA ) 10 MG tablet TAKE 1 TABLET BY MOUTH EVERY DAY 90 tablet 2   hydrochlorothiazide  (HYDRODIURIL ) 25 MG tablet TAKE 1 TABLET (25 MG TOTAL) BY MOUTH DAILY. 90 tablet 3   ibuprofen (ADVIL,MOTRIN) 200 MG tablet Take 600 mg by mouth every 6 (six) hours as needed for pain.     Triamcinolone Acetonide (NASACORT AQ NA) Place into the nose.     benzonatate  (TESSALON ) 100 MG capsule Take 1 capsule (100 mg total) by mouth 3 (three) times daily as needed for cough. 30 capsule 0   No  current facility-administered medications on file prior to visit.    Review of Systems:  As per HPI- otherwise negative.   Physical Examination: Vitals:   12/27/23 0811  BP: (!) 144/82  Pulse: (!) 59  SpO2: 97%   Vitals:   12/27/23 0811  Weight: 164 lb 9.6 oz (74.7 kg)  Height: 5' 10 (1.778 m)   Body mass index is 23.62 kg/m. Ideal Body Weight: Weight in (lb) to have BMI = 25: 173.9  GEN: no acute distress.  Normal weight, looks well  HEENT: Atraumatic, Normocephalic.  Bilateral TM wnl, oropharynx normal.  PEERL,EOMI.  Hearing aids bilaterally  Ears and Nose: No external deformity. CV: RRR, No M/G/R. No JVD. No thrill. No extra heart sounds. PULM: CTA B, no wheezes, crackles, rhonchi. No retractions. No resp. distress. No accessory muscle use. ABD: S, NT, ND, +BS. No rebound. No HSM. EXTR: No c/c/e PSYCH: Normally interactive. Conversant.   EKG: normal, compared to 2023 no change notd  Assessment and Plan: Prediabetes - Plan: Comprehensive metabolic panel with GFR, Hemoglobin A1c  Essential hypertension - Plan: Comprehensive metabolic panel with GFR, CBC  Pure hypercholesterolemia  Increased prostate specific antigen (PSA) velocity - Plan: PSA  SOB (shortness of breath) on exertion - Plan: DG Chest 2 View, EKG 12-Lead  Assessment & Plan Episodes of exertional shortness of breath in the setting of coronary artery disease with high coronary calcium  score Intermittent exertional dyspnea without chest pain or wheezing. Previous echocardiogram normal, CT scan normal. Further cardiac evaluation may be needed. - Contact cardiologist Dr. Kate for follow-up. - Advise continued exercise with caution to avoid maximum exertion until further evaluation.  If symptoms continue to happen please stop exercise and seek care - Order EKG to assess for changes since last year. - Order chest X-ray to rule out unexpected pulmonary issues.  Abnormal liver function test Previous  liver function test showed minimally elevated AST. No significant alcohol consumption reported, suggesting a transient or insignificant finding. - Order repeat liver function tests to reassess AST levels.  Elevated prostate specific antigen (PSA) PSA trending up slightly but remains within normal range. Monitoring for further increase is warranted. - Order repeat PSA test to monitor for any further increase.  Gout Current medication regimen for gout is ongoing.  Signed Harlene Schroeder, MD  Received labs, message to patient  Results for orders placed or performed in visit on 12/27/23  Comprehensive metabolic panel with GFR   Collection Time: 12/27/23  9:07 AM  Result Value Ref Range   Sodium 139 135 - 145 mEq/L   Potassium 4.8 3.5 - 5.1 mEq/L   Chloride 102 96 - 112 mEq/L   CO2 30 19 - 32 mEq/L   Glucose, Bld 97 70 - 99 mg/dL   BUN 12 6 - 23 mg/dL   Creatinine, Ser 9.05 0.40 - 1.50 mg/dL   Total Bilirubin 0.7 0.2 - 1.2 mg/dL   Alkaline Phosphatase 83 39 - 117 U/L   AST 23 0 - 37 U/L   ALT 27 0 - 53 U/L   Total Protein 6.7 6.0 - 8.3 g/dL   Albumin 4.8 3.5 - 5.2 g/dL   GFR 15.71 >39.99 mL/min   Calcium  9.6 8.4 - 10.5 mg/dL  CBC   Collection Time: 12/27/23  9:07 AM  Result Value Ref Range   WBC 5.3 4.0 - 10.5 K/uL   RBC 5.06 4.22 - 5.81 Mil/uL   Platelets 173.0 150.0 - 400.0 K/uL   Hemoglobin 14.9 13.0 - 17.0 g/dL   HCT 55.9 60.9 - 47.9 %   MCV 87.0 78.0 - 100.0 fl   MCHC 33.8 30.0 - 36.0 g/dL   RDW 86.5 88.4 - 84.4 %  Hemoglobin A1c   Collection Time: 12/27/23  9:07 AM  Result Value Ref Range   Hgb A1c MFr Bld 6.1 4.6 - 6.5 %  PSA   Collection Time: 12/27/23  9:07 AM  Result Value Ref Range   PSA 1.21 0.10 - 4.00 ng/mL    "

## 2023-12-27 ENCOUNTER — Encounter: Payer: Self-pay | Admitting: Family Medicine

## 2023-12-27 ENCOUNTER — Ambulatory Visit (INDEPENDENT_AMBULATORY_CARE_PROVIDER_SITE_OTHER): Admitting: Family Medicine

## 2023-12-27 ENCOUNTER — Ambulatory Visit (HOSPITAL_BASED_OUTPATIENT_CLINIC_OR_DEPARTMENT_OTHER)
Admission: RE | Admit: 2023-12-27 | Discharge: 2023-12-27 | Disposition: A | Discharge status: Home / Self Care | Source: Ambulatory Visit | Attending: Family Medicine | Admitting: Family Medicine

## 2023-12-27 VITALS — BP 144/82 | HR 59 | Ht 70.0 in | Wt 164.6 lb

## 2023-12-27 DIAGNOSIS — R972 Elevated prostate specific antigen [PSA]: Secondary | ICD-10-CM

## 2023-12-27 DIAGNOSIS — E7800 Pure hypercholesterolemia, unspecified: Secondary | ICD-10-CM | POA: Diagnosis not present

## 2023-12-27 DIAGNOSIS — I10 Essential (primary) hypertension: Secondary | ICD-10-CM | POA: Diagnosis not present

## 2023-12-27 DIAGNOSIS — R0602 Shortness of breath: Secondary | ICD-10-CM | POA: Insufficient documentation

## 2023-12-27 DIAGNOSIS — R7303 Prediabetes: Secondary | ICD-10-CM

## 2023-12-27 LAB — CBC
HCT: 44 % (ref 39.0–52.0)
Hemoglobin: 14.9 g/dL (ref 13.0–17.0)
MCHC: 33.8 g/dL (ref 30.0–36.0)
MCV: 87 fl (ref 78.0–100.0)
Platelets: 173 K/uL (ref 150.0–400.0)
RBC: 5.06 Mil/uL (ref 4.22–5.81)
RDW: 13.4 % (ref 11.5–15.5)
WBC: 5.3 K/uL (ref 4.0–10.5)

## 2023-12-27 LAB — COMPREHENSIVE METABOLIC PANEL WITH GFR
ALT: 27 U/L (ref 0–53)
AST: 23 U/L (ref 0–37)
Albumin: 4.8 g/dL (ref 3.5–5.2)
Alkaline Phosphatase: 83 U/L (ref 39–117)
BUN: 12 mg/dL (ref 6–23)
CO2: 30 meq/L (ref 19–32)
Calcium: 9.6 mg/dL (ref 8.4–10.5)
Chloride: 102 meq/L (ref 96–112)
Creatinine, Ser: 0.94 mg/dL (ref 0.40–1.50)
GFR: 84.28 mL/min (ref >60.00)
Glucose, Bld: 97 mg/dL (ref 70–99)
Potassium: 4.8 meq/L (ref 3.5–5.1)
Sodium: 139 meq/L (ref 135–145)
Total Bilirubin: 0.7 mg/dL (ref 0.2–1.2)
Total Protein: 6.7 g/dL (ref 6.0–8.3)

## 2023-12-27 LAB — PSA: PSA: 1.21 ng/mL (ref 0.10–4.00)

## 2023-12-27 LAB — HEMOGLOBIN A1C: Hgb A1c MFr Bld: 6.1 % (ref 4.6–6.5)

## 2024-01-03 ENCOUNTER — Telehealth: Payer: Self-pay | Admitting: *Deleted

## 2024-01-03 NOTE — Telephone Encounter (Signed)
 Called patient and appt set up to see provider 10/29 @9 :00. Address provided. Pt verbalized an understanding.

## 2024-01-08 NOTE — Progress Notes (Unsigned)
 Cardiology Office Note:    Date:  01/09/2024   ID:  Joshua [name], DOB 1956-03-09, MRN [id number]  PCP:  Watt Harlene BROCKS, MD  Cardiologist:  None  Electrophysiologist:  None   Referring MD: Watt Harlene BROCKS, MD   Chief Complaint  Patient presents with   Coronary Artery Disease    History of Present Illness:    Joshua [name] is a 68 y.o. male with a hx of CAD, hypertension, hyperlipidemia, asthma who presents for follow-up.  He was referred by Dr Watt for evaluation of elevated calcium  score, initially seen 05/11/2022.  Calcium  score on 04/25/22 was 211 (96th percentile).   Zio patch x 7 days 07/2022 showed occasional PVCs (1.4% of beats).  Echocardiogram 12/2022 showed normal biventricular function, no significant valvular disease.  Since last clinic visit, reports having dyspnea with exertion.  States that he remains very active and frequently runs.  Ran 5K this weekend and while he denied any chest pain, reported having dyspnea with exertion towards the end of the race.  Also reports feeling lightheaded during race.  Reports BP has been 110s to 150s over 70s to 80s when checks at home but mostly under 130/80.  Reports no recent palpitations.    BP Readings from Last 3 Encounters:  01/09/24 137/88  12/27/23 (!) 144/82  08/07/23 137/82     Past Medical History:  Diagnosis Date   Allergy    Arthritis    Asthma    Chronic kidney disease    GERD (gastroesophageal reflux disease)    Gout    Hyperlipidemia    Hypertension    Kidney stones     Past Surgical History:  Procedure Laterality Date   kidney stones     LITHOTRIPSY      Current Medications: Current Meds  Medication Sig   acetaminophen  (TYLENOL ) 500 MG tablet Take 500 mg by mouth every 6 (six) hours as needed.   allopurinol  (ZYLOPRIM ) 300 MG tablet TAKE 1 TABLET BY MOUTH EVERY DAY   atorvastatin  (LIPITOR) 80 MG tablet TAKE 1 TABLET BY MOUTH EVERY DAY   benazepril  (LOTENSIN ) 5 MG tablet TAKE 1  TABLET (5 MG TOTAL) BY MOUTH DAILY.   colchicine  0.6 MG tablet Take 2 pills at first sign of gout, then take 1 pil an hour later   ezetimibe  (ZETIA ) 10 MG tablet TAKE 1 TABLET BY MOUTH EVERY DAY   hydrochlorothiazide  (HYDRODIURIL ) 25 MG tablet TAKE 1 TABLET (25 MG TOTAL) BY MOUTH DAILY.   ibuprofen (ADVIL,MOTRIN) 200 MG tablet Take 600 mg by mouth every 6 (six) hours as needed for pain.   Triamcinolone Acetonide (NASACORT AQ NA) Place into the nose.     Allergies:   Aspirin, Shrimp [shellfish allergy], Benadryl [diphenhydramine hcl], and Other   Social History   Socioeconomic History   Marital status: Married    Spouse name: Not on file   Number of children: Not on file   Years of education: Not on file   Highest education level: Bachelor's degree (e.g., BA, AB, BS)  Occupational History   Not on file  Tobacco Use   Smoking status: Never   Smokeless tobacco: Never  Substance and Sexual Activity   Alcohol use: Yes    Comment: Occasional   Drug use: No   Sexual activity: Yes    Birth control/protection: None  Other Topics Concern   Not on file  Social History Narrative   Not on file   Social Drivers of Corporate Investment Banker  Strain: Low Risk  (12/26/2023)   Overall Financial Resource Strain (CARDIA)    Difficulty of Paying Living Expenses: Not hard at all  Food Insecurity: No Food Insecurity (12/26/2023)   Hunger Vital Sign    Worried About Running Out of Food in the Last Year: Never true    Ran Out of Food in the Last Year: Never true  Transportation Needs: No Transportation Needs (12/26/2023)   PRAPARE - Administrator, Civil Service (Medical): No    Lack of Transportation (Non-Medical): No  Physical Activity: Sufficiently Active (12/26/2023)   Exercise Vital Sign    Days of Exercise per Week: 4 days    Minutes of Exercise per Session: 50 min  Stress: No Stress Concern Present (12/26/2023)   [employer] of Occupational Health - Occupational  Stress Questionnaire    Feeling of Stress: Only a little  Social Connections: Socially Integrated (12/26/2023)   Social Connection and Isolation Panel    Frequency of Communication with Friends and Family: Twice a week    Frequency of Social Gatherings with Friends and Family: Three times a week    Attends Religious Services: More than 4 times per year    Active Member of Clubs or Organizations: Yes    Attends Engineer, Structural: More than 4 times per year    Marital Status: Married     Family History: The patient's family history includes Alcohol abuse in his brother, father, and maternal grandmother; Cancer in his paternal aunt and sister; Colon cancer in his paternal aunt; Diabetes in his mother; Drug abuse in his brother; Hyperlipidemia in his mother; Hypertension in his maternal grandfather and paternal grandfather; Pancreatic cancer in his mother. There is no history of Esophageal cancer, Liver cancer, Rectal cancer, or Stomach cancer.  ROS:   Please see the history of present illness.     All other systems reviewed and are negative.  EKGs/Labs/Other Studies Reviewed:    The following studies were reviewed today:   EKG:   05/11/2022: Normal sinus rhythm, rate 76, no ST abnormality 12/15/2022: Sinus bradycardia, rate 52, LVH  Recent Labs: 05/21/2023: TSH 2.48 12/27/2023: ALT 27; BUN 12; Creatinine, Ser 0.94; Hemoglobin 14.9; Platelets 173.0; Potassium 4.8; Sodium 139  Recent Lipid Panel    Component Value Date/Time   CHOL 123 12/26/2022 1051   TRIG 70 12/26/2022 1051   HDL 55 12/26/2022 1051   CHOLHDL 2.2 12/26/2022 1051   CHOLHDL 4 05/01/2022 1323   VLDL 58.4 (H) 05/01/2022 1323   LDLCALC 54 12/26/2022 1051   LDLDIRECT 96.0 05/01/2022 1323    Physical Exam:    VS:  BP 137/88 (BP Location: Right Arm, Patient Position: Sitting, Cuff Size: Normal)   Pulse (!) 52   Ht 5' 9.92 (1.776 m)   Wt 169 lb 3.2 oz (76.7 kg)   SpO2 100%   BMI 24.33 kg/m     Wt  Readings from Last 3 Encounters:  01/09/24 169 lb 3.2 oz (76.7 kg)  12/27/23 164 lb 9.6 oz (74.7 kg)  05/21/23 170 lb 6.4 oz (77.3 kg)     GEN: For well nourished, well developed in no acute distress HEENT: Normal NECK: No JVD; No carotid bruits LYMPHATICS: No lymphadenopathy CARDIAC: RRR, no murmurs, rubs, gallops RESPIRATORY:  Clear to auscultation without rales, wheezing or rhonchi  ABDOMEN: Soft, non-tender, non-distended MUSCULOSKELETAL:  No edema; No deformity  SKIN: Warm and dry NEUROLOGIC:  Alert and oriented x 3 PSYCHIATRIC:  Normal affect  ASSESSMENT:    1. Dyspnea on exertion   2. Coronary artery disease involving native coronary artery of native heart, unspecified whether angina present   3. Hyperlipidemia, unspecified hyperlipidemia type   4. Essential hypertension      PLAN:    CAD: Calcium  score on 04/25/22 was 211 (96th percentile).   -Continue atorvastatin  80 mg daily and Zetia  10 mg daily - He is reporting exertional dyspnea, concern that could represent anginal equivalent.  Recommend coronary CTA to evaluate for obstructive CAD.  Resting heart rate in 50s, will not give Lopressor prior to study  Palpitations: Zio patch x 7 days 07/2022 showed occasional PVCs (1.4% of beats).  Echocardiogram 12/2022 showed normal biventricular function, no significant valvular disease.  Hypertension: on benazepril  5 mg daily and HCTZ 25 mg daily.  Borderline elevated in clinic today but reports under control when checks at home.  Check BMET, magnesium  Hyperlipidemia: on atorvastatin  80 mg daily, LDL 99 on 07/28/2022.  Zetia  10 mg daily added.  LDL 54 on 12/26/2022.  Check lipid panel  RTC in 6 months   Medication Adjustments/Labs and Tests Ordered: Current medicines are reviewed at length with the patient today.  Concerns regarding medicines are outlined above.  Orders Placed This Encounter  Procedures   CT CORONARY MORPH W/CTA COR W/SCORE W/CA W/CM &/OR WO/CM   Basic  Metabolic Panel (BMET)   Magnesium   Lipid panel   No orders of the defined types were placed in this encounter.   Patient Instructions  Medication Instructions:  Your physician recommends that you continue on your current medications as directed. Please refer to the Current Medication list given to you today.  *If you need a refill on your cardiac medications before your next appointment, please call your pharmacy*  Lab Work: Bmet, mg, lipid today If you have labs (blood work) drawn today and your tests are completely normal, you will receive your results only by: MyChart Message (if you have MyChart) OR A paper copy in the mail If you have any lab test that is abnormal or we need to change your treatment, we will call you to review the results.  Testing/Procedures:   Your cardiac CT will be scheduled at one of the below locations:   [facility] [address]  [place], [place] [address]   If scheduled at the [facility] at [address], please enter the parking lot using the [address] entrance and use the FREE valet service at the patient drop-off area. Enter the building and check-in with registration on the main floor.  Please follow these instructions carefully (unless otherwise directed):  An IV will be required for this test and Nitroglycerin will be given.  Hold all erectile dysfunction medications at least 3 days (72 hrs) prior to test. (Ie viagra, cialis, sildenafil, tadalafil, etc)   On the Night Before the Test: Be sure to Drink plenty of water. Do not consume any caffeinated/decaffeinated beverages or chocolate 12 hours prior to your test. Do not take any antihistamines 12 hours prior to your test.   On the Day of the Test: Drink plenty of water until 1 hour prior to the test. Do not eat any food 1 hour prior to test. You may take your regular medications prior to the test.  If you take  Furosemide/Hydrochlorothiazide /Spironolactone/Chlorthalidone, please HOLD on the morning of the test. Patients who wear a continuous glucose monitor MUST remove the device prior to scanning.  After the Test: Drink plenty of water. After receiving IV contrast, you may experience a mild flushed feeling. This is normal. On occasion, you may experience a mild rash up to 24 hours after the test. This is not dangerous. If this occurs, you can take Benadryl 25 mg, Zyrtec, Claritin, or Allegra and increase your fluid intake. (Patients taking Tikosyn should avoid Benadryl, and may take Zyrtec, Claritin, or Allegra) If you experience trouble breathing, this can be serious. If it is severe call 911 IMMEDIATELY. If it is mild, please call our office.  We will call to schedule your test 2-4 weeks out understanding that some insurance companies will need an authorization prior to the service being performed.   For more information and frequently asked questions, please visit our website : [email or web address]  For non-scheduling related questions, please contact the cardiac imaging nurse navigator should you have any questions/concerns: Cardiac Imaging Nurse Navigators Direct Office Dial: [phone]   For scheduling needs, including cancellations and rescheduling, please call Brittany, [phone].   Follow-Up: At [facility], you and your health needs are our priority.  As part of our continuing mission to provide you with exceptional heart care, our providers are all part of one team.  This team includes your primary Cardiologist (physician) and Advanced Practice Providers or APPs (Physician Assistants and Nurse Practitioners) who all work together to provide you with the care you need, when you need it.  Your next appointment:   6 month  Provider:   Dr. Kate   We recommend signing up for the patient portal called MyChart.  Sign up information is provided on this After  Visit Summary.  MyChart is used to connect with patients for Virtual Visits (Telemedicine).  Patients are able to view lab/test results, encounter notes, upcoming appointments, etc.  Non-urgent messages can be sent to your provider as well.   To learn more about what you can do with MyChart, go to [email or web address].   Other Instructions none           Signed, Lonni LITTIE Kate, MD  01/09/2024 10:54 AM    [facility]

## 2024-01-09 ENCOUNTER — Ambulatory Visit: Attending: Cardiology | Admitting: Cardiology

## 2024-01-09 ENCOUNTER — Encounter: Payer: Self-pay | Admitting: Cardiology

## 2024-01-09 VITALS — BP 137/88 | HR 52 | Ht 69.92 in | Wt 169.2 lb

## 2024-01-09 DIAGNOSIS — R0609 Other forms of dyspnea: Secondary | ICD-10-CM

## 2024-01-09 DIAGNOSIS — I2510 Atherosclerotic heart disease of native coronary artery without angina pectoris: Secondary | ICD-10-CM

## 2024-01-09 DIAGNOSIS — I10 Essential (primary) hypertension: Secondary | ICD-10-CM

## 2024-01-09 DIAGNOSIS — E785 Hyperlipidemia, unspecified: Secondary | ICD-10-CM

## 2024-01-09 NOTE — Patient Instructions (Signed)
 Medication Instructions:  Your physician recommends that you continue on your current medications as directed. Please refer to the Current Medication list given to you today.  *If you need a refill on your cardiac medications before your next appointment, please call your pharmacy*  Lab Work: Bmet, mg, lipid today If you have labs (blood work) drawn today and your tests are completely normal, you will receive your results only by: MyChart Message (if you have MyChart) OR A paper copy in the mail If you have any lab test that is abnormal or we need to change your treatment, we will call you to review the results.  Testing/Procedures:   Your cardiac CT will be scheduled at one of the below locations:   [facility] [address]  [place], [place] [address]   If scheduled at the [facility] at [address], please enter the parking lot using the [address] entrance and use the FREE valet service at the patient drop-off area. Enter the building and check-in with registration on the main floor.  Please follow these instructions carefully (unless otherwise directed):  An IV will be required for this test and Nitroglycerin will be given.  Hold all erectile dysfunction medications at least 3 days (72 hrs) prior to test. (Ie viagra, cialis, sildenafil, tadalafil, etc)   On the Night Before the Test: Be sure to Drink plenty of water. Do not consume any caffeinated/decaffeinated beverages or chocolate 12 hours prior to your test. Do not take any antihistamines 12 hours prior to your test.   On the Day of the Test: Drink plenty of water until 1 hour prior to the test. Do not eat any food 1 hour prior to test. You may take your regular medications prior to the test.  If you take Furosemide/Hydrochlorothiazide /Spironolactone/Chlorthalidone, please HOLD on the morning of the test. Patients who wear a continuous glucose monitor MUST remove the  device prior to scanning.  After the Test: Drink plenty of water. After receiving IV contrast, you may experience a mild flushed feeling. This is normal. On occasion, you may experience a mild rash up to 24 hours after the test. This is not dangerous. If this occurs, you can take Benadryl 25 mg, Zyrtec, Claritin, or Allegra and increase your fluid intake. (Patients taking Tikosyn should avoid Benadryl, and may take Zyrtec, Claritin, or Allegra) If you experience trouble breathing, this can be serious. If it is severe call 911 IMMEDIATELY. If it is mild, please call our office.  We will call to schedule your test 2-4 weeks out understanding that some insurance companies will need an authorization prior to the service being performed.   For more information and frequently asked questions, please visit our website : [email or web address]  For non-scheduling related questions, please contact the cardiac imaging nurse navigator should you have any questions/concerns: Cardiac Imaging Nurse Navigators Direct Office Dial: [phone]   For scheduling needs, including cancellations and rescheduling, please call Brittany, [phone].   Follow-Up: At [facility], you and your health needs are our priority.  As part of our continuing mission to provide you with exceptional heart care, our providers are all part of one team.  This team includes your primary Cardiologist (physician) and Advanced Practice Providers or APPs (Physician Assistants and Nurse Practitioners) who all work together to provide you with the care you need, when you need it.  Your next appointment:   6 month  Provider:   Dr. Kate  We recommend signing up for the patient portal called MyChart.  Sign up information is provided on this After Visit Summary.  MyChart is used to connect with patients for Virtual Visits (Telemedicine).  Patients are able to view lab/test results, encounter notes, upcoming  appointments, etc.  Non-urgent messages can be sent to your provider as well.   To learn more about what you can do with MyChart, go to [email or web address].   Other Instructions none

## 2024-01-09 NOTE — Telephone Encounter (Signed)
 Yes that is OK to schedule for January, but if having worsening dyspnea or develops any chest pain we need to get done ASAP

## 2024-01-10 ENCOUNTER — Ambulatory Visit: Payer: Self-pay | Admitting: Cardiology

## 2024-01-10 LAB — BASIC METABOLIC PANEL WITH GFR
BUN/Creatinine Ratio: 10 (ref 10–24)
BUN: 10 mg/dL (ref 8–27)
CO2: 26 mmol/L (ref 20–29)
Calcium: 9.9 mg/dL (ref 8.6–10.2)
Chloride: 99 mmol/L (ref 96–106)
Creatinine, Ser: 0.97 mg/dL (ref 0.76–1.27)
Glucose: 97 mg/dL (ref 70–99)
Potassium: 4.9 mmol/L (ref 3.5–5.2)
Sodium: 138 mmol/L (ref 134–144)
eGFR: 86 mL/min/1.73 (ref >59)

## 2024-01-10 LAB — LIPID PANEL
Chol/HDL Ratio: 2.2 ratio (ref 0.0–5.0)
Cholesterol, Total: 133 mg/dL (ref 100–199)
HDL: 61 mg/dL (ref >39)
LDL Chol Calc (NIH): 58 mg/dL (ref 0–99)
Triglycerides: 71 mg/dL (ref 0–149)
VLDL Cholesterol Cal: 14 mg/dL (ref 5–40)

## 2024-01-10 LAB — MAGNESIUM: Magnesium: 2.2 mg/dL (ref 1.6–2.3)

## 2024-01-31 NOTE — Telephone Encounter (Signed)
 Addressed in duplicate mychart message and sent to Nurse for scheduling per Dr Kate

## 2024-01-31 NOTE — Telephone Encounter (Signed)
 If he is feeling more short of breath, would not delay scheduling the coronary CTA (this was ordered at recent appointment but he wanted to wait till January to schedule).  If he is agreeable lets go ahead and get it scheduled

## 2024-02-01 ENCOUNTER — Encounter: Payer: Self-pay | Admitting: Cardiology

## 2024-02-11 ENCOUNTER — Telehealth: Admitting: Emergency Medicine

## 2024-02-11 DIAGNOSIS — L03211 Cellulitis of face: Secondary | ICD-10-CM

## 2024-02-11 MED ORDER — SULFAMETHOXAZOLE-TRIMETHOPRIM 800-160 MG PO TABS: 1.0000 | ORAL_TABLET | Freq: Two times a day (BID) | ORAL | 0 refills | Status: AC

## 2024-02-11 NOTE — Progress Notes (Signed)
 E Visit for Cellulitis  We are sorry that you are not feeling well. Here is how we plan to help!  Based on what you shared with me it looks like you have cellulitis.  Cellulitis looks like areas of skin redness, swelling, and warmth; it develops as a result of bacteria entering under the skin. Little red spots and/or bleeding can be seen in skin, and tiny surface sacs containing fluid can occur. Fever can be present. Cellulitis is almost always on one side of a body, and the lower limbs are the most common site of involvement.   I have prescribed:  Bactrim DS 1 tablet by mouth twice a day for 7 days  HOME CARE:  Take your medications as ordered and take all of them, even if the skin irritation appears to be healing.   GET HELP RIGHT AWAY IF:  Symptoms that don't begin to go away within 48 hours. Severe redness persists or worsens If the area turns color, spreads or swells. If it blisters and opens, develops yellow-brown crust or bleeds. You develop a fever or chills. If the pain increases or becomes unbearable.  Are unable to keep fluids and food down.  MAKE SURE YOU   Understand these instructions. Will watch your condition. Will get help right away if you are not doing well or get worse.  Thank you for choosing an e-visit. Your e-visit answers were reviewed by a board certified advanced clinical practitioner to complete your personal care plan. Depending upon the condition, your plan could have included both over the counter or prescription medications. Please review your pharmacy choice. Make sure the pharmacy is open so you can pick up prescription now. If there is a problem, you may contact your provider through [employer] and have the prescription routed to another pharmacy. Your safety is important to us . If you have drug allergies check your prescription carefully.  For the next 24 hours you can use MyChart to ask questions about today's visit, request a non-urgent call  back, or ask for a work or school excuse. You will get an email in the next two days asking about your experience. I hope that your e-visit has been valuable and will speed your recovery.  I have spent 5 minutes in review of e-visit questionnaire, review and updating patient chart, medical decision making and response to patient.   Jon Belt, PhD, FNP-BC

## 2024-02-13 ENCOUNTER — Other Ambulatory Visit: Payer: Self-pay | Admitting: Family Medicine

## 2024-02-13 ENCOUNTER — Other Ambulatory Visit: Payer: Self-pay | Admitting: Cardiology

## 2024-02-13 DIAGNOSIS — I10 Essential (primary) hypertension: Secondary | ICD-10-CM

## 2024-02-20 ENCOUNTER — Encounter (HOSPITAL_COMMUNITY): Payer: Self-pay

## 2024-02-22 ENCOUNTER — Ambulatory Visit (HOSPITAL_COMMUNITY)
Admission: RE | Admit: 2024-02-22 | Discharge: 2024-02-22 | Disposition: A | Source: Ambulatory Visit | Attending: Cardiology

## 2024-02-22 DIAGNOSIS — I700 Atherosclerosis of aorta: Secondary | ICD-10-CM

## 2024-02-22 DIAGNOSIS — R0609 Other forms of dyspnea: Secondary | ICD-10-CM

## 2024-02-22 MED ORDER — IOHEXOL 350 MG/ML SOLN
100.0000 mL | Freq: Once | INTRAVENOUS | Status: AC | PRN
  Administered 2024-02-22: 100 mL via INTRAVENOUS

## 2024-02-22 MED ORDER — NITROGLYCERIN 0.4 MG SL SUBL
0.8000 mg | SUBLINGUAL_TABLET | Freq: Once | SUBLINGUAL | Status: AC
  Administered 2024-02-22: 0.8 mg via SUBLINGUAL

## 2024-02-25 ENCOUNTER — Other Ambulatory Visit (HOSPITAL_COMMUNITY)

## 2024-03-05 ENCOUNTER — Telehealth: Admitting: Physician Assistant

## 2024-03-05 DIAGNOSIS — J019 Acute sinusitis, unspecified: Secondary | ICD-10-CM

## 2024-03-05 DIAGNOSIS — B9689 Other specified bacterial agents as the cause of diseases classified elsewhere: Secondary | ICD-10-CM | POA: Diagnosis not present

## 2024-03-05 MED ORDER — FLUTICASONE PROPIONATE 50 MCG/ACT NA SUSP: 2.0000 | Freq: Every day | NASAL | 0 refills | Status: AC

## 2024-03-05 MED ORDER — AMOXICILLIN-POT CLAVULANATE 875-125 MG PO TABS: 1.0000 | ORAL_TABLET | Freq: Two times a day (BID) | ORAL | 0 refills | Status: AC

## 2024-03-05 NOTE — Progress Notes (Signed)

## 2024-03-17 ENCOUNTER — Encounter: Payer: Self-pay | Admitting: Family Medicine

## 2024-03-17 DIAGNOSIS — R0602 Shortness of breath: Secondary | ICD-10-CM

## 2024-03-18 NOTE — Addendum Note (Signed)
 Addended by: WATT RAISIN C on: 03/18/2024 02:48 PM   Modules accepted: Orders

## 2024-04-03 ENCOUNTER — Encounter: Payer: Self-pay | Admitting: Pulmonary Disease

## 2024-04-03 ENCOUNTER — Ambulatory Visit: Admitting: Pulmonary Disease

## 2024-04-03 VITALS — BP 124/86 | HR 63 | Temp 97.4°F | Ht 70.0 in | Wt 167.0 lb

## 2024-04-03 DIAGNOSIS — J45909 Unspecified asthma, uncomplicated: Secondary | ICD-10-CM

## 2024-04-03 DIAGNOSIS — J45998 Other asthma: Secondary | ICD-10-CM | POA: Diagnosis not present

## 2024-04-03 LAB — CBC WITH DIFFERENTIAL/PLATELET
Basophils Absolute: 0 K/uL (ref 0.0–0.1)
Basophils Relative: 0.5 % (ref 0.0–3.0)
Eosinophils Absolute: 0.2 K/uL (ref 0.0–0.7)
Eosinophils Relative: 3 % (ref 0.0–5.0)
HCT: 45.4 % (ref 39.0–52.0)
Hemoglobin: 15.9 g/dL (ref 13.0–17.0)
Lymphocytes Relative: 37.3 % (ref 12.0–46.0)
Lymphs Abs: 2.1 K/uL (ref 0.7–4.0)
MCHC: 35 g/dL (ref 30.0–36.0)
MCV: 86.5 fl (ref 78.0–100.0)
Monocytes Absolute: 0.6 K/uL (ref 0.1–1.0)
Monocytes Relative: 11.1 % (ref 3.0–12.0)
Neutro Abs: 2.7 K/uL (ref 1.4–7.7)
Neutrophils Relative %: 48.1 % (ref 43.0–77.0)
Platelets: 173 K/uL (ref 150.0–400.0)
RBC: 5.24 Mil/uL (ref 4.22–5.81)
RDW: 13.7 % (ref 11.5–15.5)
WBC: 5.7 K/uL (ref 4.0–10.5)

## 2024-04-03 LAB — POCT EXHALED NITRIC OXIDE: FeNO level (ppb): 26 (ref ?–50)

## 2024-04-03 MED ORDER — ALBUTEROL SULFATE HFA 108 (90 BASE) MCG/ACT IN AERS: 2.0000 | INHALATION_SPRAY | Freq: Four times a day (QID) | RESPIRATORY_TRACT | 6 refills | Status: AC | PRN

## 2024-04-03 NOTE — Progress Notes (Signed)
 Joshua [name]    [id number]    04-06-1955  Primary Care Physician:Copland, Harlene BROCKS, MD  Referring Physician: Watt Harlene BROCKS, MD [address] [place],  [place] [address]  Chief complaint: Consult for dyspnea on exertion  HPI: 69 y.o. who  has a past medical history of Allergy, Arthritis, Asthma, Chronic kidney disease, GERD (gastroesophageal reflux disease), Gout, Hyperlipidemia, Hypertension, and Kidney stones.  Discussed the use of AI scribe software for clinical note transcription with the patient, who gave verbal consent to proceed.  History of Present Illness Joshua [name] is a 69 year old male with non-obstructive coronary artery disease who presents with shortness of breath during exercise. He is accompanied by his wife, [name].  Exertional dyspnea and exercise intolerance - Shortness of breath occurs during running, with sudden loss of power early in the run - Requires a 30-second rest before resuming activity - Symptoms occur at moderate effort, once or twice early in the run, then able to complete the run without further symptoms - Onset of current symptoms began in October or November of last year - Similar episodes have recurred about every six months since first COVID-19 infection in early 2022 - Running for approximately 50 years with good familiarity with usual exertional capacity - Remains active, running four days per week  Coronary artery disease - Has been evaluated by cardiology with mild non-obstructive coronary artery disease with coronary calcification but no blockage - Echo shows good cardiac function, no pulmonary hypertension  Respiratory history - Diagnosed with exercise-induced asthma in his thirties, previously used albuterol  - No asthma symptoms since late forties - Several sinus infections over the past few months, treated with one course of steroids  Peripheral symptoms - Hands and feet get cold more often  than before, without color change  Relevant Pulmonary history: Pets: Used to have a cat and a dog 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  No feather pillows or comforters No h/o chemo/XRT/amiodarone/macrodantin/MTX  No exposure to asbestos, silica or other organic allergens  Smoking history: Never smoker Travel history: Originally from [place] .  No significant recent travel Family history: No family history of lung disease   Outpatient Encounter Medications as of 04/03/2024  Medication Sig   acetaminophen  (TYLENOL ) 500 MG tablet Take 500 mg by mouth every 6 (six) hours as needed.   allopurinol  (ZYLOPRIM ) 300 MG tablet TAKE 1 TABLET BY MOUTH EVERY DAY   atorvastatin  (LIPITOR) 80 MG tablet TAKE 1 TABLET BY MOUTH EVERY DAY   benazepril  (LOTENSIN ) 5 MG tablet TAKE 1 TABLET (5 MG TOTAL) BY MOUTH DAILY.   colchicine  0.6 MG tablet Take 2 pills at first sign of gout, then take 1 pil an hour later   ezetimibe  (ZETIA ) 10 MG tablet TAKE 1 TABLET BY MOUTH EVERY DAY   fluticasone  (FLONASE ) 50 MCG/ACT nasal spray Place 2 sprays into both nostrils daily.   hydrochlorothiazide  (HYDRODIURIL ) 25 MG tablet TAKE 1 TABLET (25 MG TOTAL) BY MOUTH DAILY.   ibuprofen (ADVIL,MOTRIN) 200 MG tablet Take 600 mg by mouth every 6 (six) hours as needed for pain.   amoxicillin -clavulanate (AUGMENTIN ) 875-125 MG tablet Take 1 tablet by mouth 2 (two) times daily.   No facility-administered encounter medications on file as of 04/03/2024.     Physical Exam: Today's Vitals   04/03/24 0823  BP: (!) 155/72  Pulse: 63  Temp: (!) 97.4 F (36.3 C)  TempSrc: Oral  SpO2: 99%  Weight: 167 lb (75.8 kg)  Height: 5' 10 (1.778 m)   Body mass index is 23.96 kg/m.  Physical Exam GEN: No acute distress. CV: Regular rate and rhythm, no murmurs. LUNGS: Clear to auscultation bilaterally, normal respiratory effort. SKIN JOINTS: Warm and dry, no rash.    Data  Reviewed: Imaging: Cardiac CT 04/25/2022-visualized lungs are clear Chest x-ray 12/27/2023-no acute cardiopulmonary abnormality CT coronary 02/22/2024-visualized lungs are clear I have reviewed the images personally  PFTs:  Labs: CBC 12/22/2012-WBC 7.7, eos 5%, absolute eosinophil count 385   FeNO 04/03/2024- 26 Assessment & Plan Exercise-induced asthma Intermittent dyspnea during moderate exercise, particularly running, with symptoms resolving within a minute of rest. Symptoms began post-COVID infection in 2021-2022, with recent exacerbation in October-November 2025. CT cardiac and chest x-ray show no significant abnormalities. Differential includes exercise-induced asthma, non-allergic inflammatory asthma, and less likely, pulmonary embolism. Normal FENO eosinophilic inflammation test suggests non-allergic inflammatory asthma. Low suspicion for pulmonary embolism, but D Dimer blood test to rule out will be conducted. Previous effective response to albuterol  supports exercise-induced asthma diagnosis. - Prescribed albuterol  inhaler, two puffs before exercise. - Ordered blood tests to rule out pulmonary embolism and assess for allergic inflammatory asthma. - Will schedule lung function tests for follow-up visit. - Will consider cardiopulmonary exercise test if symptoms persist after initial evaluations.  Recommendations: CBC with differential, IgE, D-dimer PFTs Start albuterol  before exercise  Tayo Maute MD [facility] 04/03/2024, 8:29 AM  CC: Copland, Harlene BROCKS, MD   "

## 2024-04-03 NOTE — Patient Instructions (Signed)
" °  VISIT SUMMARY: During your visit, we discussed your shortness of breath during exercise and reviewed your history of coronary artery disease and respiratory issues. We have developed a plan to manage your symptoms and rule out other potential causes.  YOUR PLAN: EXERCISE-INDUCED ASTHMA: You experience shortness of breath during moderate exercise, which resolves with rest. This began after your COVID-19 infection and has recently worsened. -Use an albuterol  inhaler, taking two puffs before exercise. -We will conduct blood tests to rule out pulmonary embolism and check for allergic inflammatory asthma. -We will schedule lung function tests for your follow-up visit. -If symptoms persist, we may consider a cardiopulmonary exercise test.    Contains text generated by [employer].   "

## 2024-04-04 ENCOUNTER — Ambulatory Visit: Payer: Self-pay | Admitting: Pulmonary Disease

## 2024-04-04 LAB — IGE: IgE (Immunoglobulin E), Serum: 87 kU/L

## 2024-04-04 LAB — D-DIMER, QUANTITATIVE: D-Dimer, Quant: 0.22 ug{FEU}/mL

## 2024-05-26 ENCOUNTER — Encounter: Admitting: Family Medicine

## 2024-06-25 ENCOUNTER — Ambulatory Visit: Admitting: Pulmonary Disease

## 2024-06-25 ENCOUNTER — Encounter
